# Patient Record
Sex: Male | Born: 1970 | Race: White | Hispanic: No | Marital: Married | Smoking: Former smoker
Health system: Southern US, Community
[De-identification: ages and names within clinical notes are randomized; demographics above are authoritative.]

## PROBLEM LIST (undated history)

## (undated) DIAGNOSIS — E215 Disorder of parathyroid gland, unspecified: Secondary | ICD-10-CM

## (undated) DIAGNOSIS — Z5189 Encounter for other specified aftercare: Secondary | ICD-10-CM

## (undated) DIAGNOSIS — E785 Hyperlipidemia, unspecified: Secondary | ICD-10-CM

## (undated) DIAGNOSIS — E7800 Pure hypercholesterolemia, unspecified: Secondary | ICD-10-CM

## (undated) DIAGNOSIS — K219 Gastro-esophageal reflux disease without esophagitis: Secondary | ICD-10-CM

## (undated) DIAGNOSIS — D619 Aplastic anemia, unspecified: Secondary | ICD-10-CM

## (undated) DIAGNOSIS — I10 Essential (primary) hypertension: Secondary | ICD-10-CM

## (undated) HISTORY — DX: Disorder of parathyroid gland, unspecified: E21.5

## (undated) HISTORY — DX: Gastro-esophageal reflux disease without esophagitis: K21.9

## (undated) HISTORY — PX: VASECTOMY: SHX75

## (undated) HISTORY — DX: Encounter for other specified aftercare: Z51.89

## (undated) HISTORY — DX: Hyperlipidemia, unspecified: E78.5

## (undated) HISTORY — PX: PARATHYROIDECTOMY: SHX19

## (undated) HISTORY — DX: Pure hypercholesterolemia, unspecified: E78.00

## (undated) HISTORY — DX: Essential (primary) hypertension: I10

## (undated) HISTORY — DX: Aplastic anemia, unspecified: D61.9

## (undated) HISTORY — PX: BONE MARROW TRANSPLANT: SHX200

## (undated) HISTORY — PX: OTHER SURGICAL HISTORY: SHX169

---

## 1996-01-13 DIAGNOSIS — D619 Aplastic anemia, unspecified: Secondary | ICD-10-CM

## 1996-01-13 HISTORY — DX: Aplastic anemia, unspecified: D61.9

## 1996-01-13 HISTORY — PX: BONE MARROW TRANSPLANT: SHX200

## 2001-05-20 ENCOUNTER — Emergency Department (HOSPITAL_COMMUNITY): Admission: EM | Admit: 2001-05-20 | Discharge: 2001-05-20 | Payer: Self-pay | Admitting: Emergency Medicine

## 2001-05-21 ENCOUNTER — Emergency Department (HOSPITAL_COMMUNITY): Admission: EM | Admit: 2001-05-21 | Discharge: 2001-05-21 | Payer: Self-pay | Admitting: Emergency Medicine

## 2001-05-24 ENCOUNTER — Encounter: Payer: Self-pay | Admitting: Internal Medicine

## 2001-05-24 ENCOUNTER — Ambulatory Visit (HOSPITAL_COMMUNITY): Admission: RE | Admit: 2001-05-24 | Discharge: 2001-05-24 | Payer: Self-pay | Admitting: Internal Medicine

## 2002-05-23 ENCOUNTER — Encounter: Payer: Self-pay | Admitting: Internal Medicine

## 2002-05-23 ENCOUNTER — Ambulatory Visit (HOSPITAL_COMMUNITY): Admission: RE | Admit: 2002-05-23 | Discharge: 2002-05-23 | Payer: Self-pay | Admitting: Internal Medicine

## 2002-09-01 ENCOUNTER — Encounter: Payer: Self-pay | Admitting: Orthopedic Surgery

## 2002-09-01 ENCOUNTER — Ambulatory Visit: Admission: RE | Admit: 2002-09-01 | Discharge: 2002-09-01 | Payer: Self-pay | Admitting: Orthopedic Surgery

## 2002-09-04 ENCOUNTER — Encounter: Payer: Self-pay | Admitting: Orthopedic Surgery

## 2002-09-04 ENCOUNTER — Ambulatory Visit (HOSPITAL_COMMUNITY): Admission: RE | Admit: 2002-09-04 | Discharge: 2002-09-04 | Payer: Self-pay | Admitting: Orthopedic Surgery

## 2004-01-25 ENCOUNTER — Ambulatory Visit (HOSPITAL_COMMUNITY): Admission: RE | Admit: 2004-01-25 | Discharge: 2004-01-25 | Payer: Self-pay | Admitting: Surgery

## 2004-02-04 ENCOUNTER — Ambulatory Visit (HOSPITAL_COMMUNITY): Admission: RE | Admit: 2004-02-04 | Discharge: 2004-02-04 | Payer: Self-pay | Admitting: Surgery

## 2004-04-03 ENCOUNTER — Ambulatory Visit (HOSPITAL_COMMUNITY): Admission: RE | Admit: 2004-04-03 | Discharge: 2004-04-04 | Payer: Self-pay | Admitting: Surgery

## 2010-02-01 ENCOUNTER — Encounter: Payer: Self-pay | Admitting: Surgery

## 2010-04-13 ENCOUNTER — Inpatient Hospital Stay (INDEPENDENT_AMBULATORY_CARE_PROVIDER_SITE_OTHER)
Admission: RE | Admit: 2010-04-13 | Discharge: 2010-04-13 | Disposition: A | Discharge status: Home / Self Care | Payer: Commercial Managed Care - PPO | Source: Ambulatory Visit | Attending: Family Medicine | Admitting: Family Medicine

## 2010-04-13 ENCOUNTER — Ambulatory Visit (INDEPENDENT_AMBULATORY_CARE_PROVIDER_SITE_OTHER): Payer: Commercial Managed Care - PPO

## 2010-04-13 DIAGNOSIS — S60229A Contusion of unspecified hand, initial encounter: Secondary | ICD-10-CM

## 2012-10-11 ENCOUNTER — Ambulatory Visit (HOSPITAL_COMMUNITY)
Admission: RE | Admit: 2012-10-11 | Discharge: 2012-10-11 | Disposition: A | Discharge status: Home / Self Care | Payer: 59 | Source: Ambulatory Visit | Attending: Orthopedic Surgery | Admitting: Orthopedic Surgery

## 2012-10-11 DIAGNOSIS — M79609 Pain in unspecified limb: Secondary | ICD-10-CM | POA: Insufficient documentation

## 2012-10-11 DIAGNOSIS — R262 Difficulty in walking, not elsewhere classified: Secondary | ICD-10-CM | POA: Insufficient documentation

## 2012-10-11 DIAGNOSIS — M79672 Pain in left foot: Secondary | ICD-10-CM | POA: Insufficient documentation

## 2012-10-11 DIAGNOSIS — IMO0001 Reserved for inherently not codable concepts without codable children: Secondary | ICD-10-CM | POA: Insufficient documentation

## 2012-10-11 NOTE — Evaluation (Signed)
Physical Therapy Evaluation  Patient Details  Name: Kyle Gilbert MRN: [id number] Date of Birth: 27-Oct-1970  Today's Date: 10/11/2012 Time: 0801-0845 PT Time Calculation (min): 44 min Charges: 1 evaluation 8' [place]             Visit#: 1 of 8  Re-eval: 11/10/12 Assessment Diagnosis: Lt heel pain Next MD Visit: Dr. Victorino Dike  Past Medical History: Aplastic Anemia w/bone marrow transplant; Avascular necrosis Bil hips Past Surgical History: No past surgical history on file.  Subjective Symptoms/Limitations Symptoms: Pt is a 42 year old male referred to PT for Lt heel pain which he reports has started about a year ago.  He believes it is due to his positioning during driving and places most of his weight on his heel.  He reports that he has not been wearing the best footwear for the past few months.  He denies use of orthotics.  He has been trying some gentle ROM exercises given to him by his MD and states that it helps.  Most pain is when he is putting full weight on it.  States that he "clomps around the house" and notices he does not push off on his toes as much as he strikes his heel.  Pertinent History: apasltic anemia with avascular necrosis to Bil Hips - reports he is always in pain with his hips.  How long can you walk comfortably?: moderate difficulty with 2 blocks.  Patient Stated Goals: decrease pain and be able to walk longer without as much pain.  Pain Assessment Currently in Pain?: Yes Pain Score: 3  Pain Location: Heel Pain Orientation: Left Pain Type: Chronic pain Pain Onset: More than a month ago Pain Frequency: Intermittent Pain Relieving Factors: sitting without pressure on it.  Effect of Pain on Daily Activities: difficulty walking without pain.   Balance Screening Balance Screen Has the patient fallen in the past 6 months: No Has the patient had a decrease in activity level because of a fear of falling? : No Is the patient reluctant to leave their home because of a  fear of falling? 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 Comments: Enjoys being with his family, playing golf  Cognition/Observation Observation/Other Assessments Observations: Lt pes planus w.moderate toe out  Sensation/Coordination/Flexibility/Functional Tests Coordination Gross Motor Movements are Fluid and Coordinated: No Coordination and Movement Description: able to spread toes; impaired toe flexion and extension coordinated movements  Assessment LLE PROM (degrees) LLE Overall PROM Comments: Ankle Inversion: 20; Ankle Eversion: 20 Left Ankle Dorsiflexion: 5 Left Ankle Plantar Flexion: 40 LLE Strength Left Hip Flexion: 5/5 Left Hip Extension: 5/5 Left Hip ABduction: 5/5 Left Hip ADduction: 5/5 Left Knee Flexion: 4/5 Left Knee Extension: 5/5 Left Ankle Dorsiflexion: 5/5 Left Ankle Plantar Flexion: 4/5 Left Ankle Inversion: 5/5 Left Ankle Eversion: 5/5 Palpation Palpation: pain and tenderness with moderate palpation to lateral calcaneous.  Moderate fascial restrictions to lateral calcaneous, plantar fascia and gastroc.  Gastroc muscle spasms.  Decreased posterior tibialis muscle activation   Mobility/Balance  Ambulation/Gait Ambulation/Gait: Yes Gait Pattern: Antalgic;Lateral hip instability (Lt toe out, Lt pes planus)   Exercise/Treatments Ankle Stretches Soleus Stretch: 1 rep;30 seconds Gastroc Stretch: 1 rep;30 seconds Other Stretch: Long sitting hamstring/gastroc stretch w/towel Ankle Exercises - Seated Towel Crunch: 1 rep Other Seated Ankle Exercises: Attempted Toe Yoga: flexion and extension; Toe abduction 10 reps  Modalities Modalities: Ultrasound Manual Therapy Manual  Therapy: Myofascial release Myofascial Release: prone to Lt gastroc to decrease fascial restrictions.  Ultrasound Ultrasound Location: Prone to Lt  heel 8 minutes 0.8 w/cm2 cont. small head 3 mHz Ultrasound Goals: Pain  Physical Therapy Assessment and Plan PT Assessment and Plan Clinical Impression Statement: Pt is a 42 year old male referred to PT for Lt heel pain with impairments listed below.  at this time pt is most limited by his significant Lt pes planus with gastroc tightness and decreased posterior tibialis muscule activaition.  Pt will benefit from skilled therapeutic intervention in order to improve on the following deficits: Abnormal gait;Decreased strength;Decreased range of motion;Impaired flexibility;Pain PT Frequency: Min 2X/week PT Duration: 6 weeks PT Treatment/Interventions: Gait training;Stair training;Functional mobility training;Therapeutic activities;Therapeutic exercise;Balance training;Neuromuscular re-education;Patient/family education;Manual techniques;Modalities PT Plan: Pt is allowed iontophoresis if needed.  Continue with foot strengthening activities, Ultrasound and manual techniques to decrease pain.  Gastroc, solues and plantar fascia stretching. Towel crunches, towel inversion, eversion, Theraband 4 way w/blue.  Progress towards rocker board, heel and toe raises, toe walking, vector stance.  Progress towards SLS on foam     Goals Home Exercise Program Pt/caregiver will Perform Home Exercise Program: Independently PT Goal: Perform Home Exercise Program - Progress: Goal set today PT Short Term Goals Time to Complete Short Term Goals: 3 weeks PT Short Term Goal 1: Pt will present with minimal fascial restrictions to Lt gastroc, calcenous and plantar fascial region for decreased pain.  PT Short Term Goal 2: Pt will report heel pain less than a 2/10 while wearing shoes for work and driving.  PT Short Term Goal 3: Pt will improve his funcitonal gastroc PF strength and perform 10 single leg heel lifts to encourage toe off during ambulation.  PT Short Term Goal 4: Pt will improve hamstring strength to 5/5 in order  to ambulate with improved mechanics.  PT Long Term Goals Time to Complete Long Term Goals:  (6 weeks) PT Long Term Goal 1: Pt will present without fascial restriction to Lt heel and gastroc in order to report 1/10 pain to partiicpate in golfing activities.  PT Long Term Goal 2: Pt will improve improve hip strength to [facility] in order to ambulate with normalized gait mechanics for 1 mile in order to attend childrens activities.   Problem List Patient Active Problem List   Diagnosis Date Noted  . Pain of left heel 10/11/2012    PT - End of Session Activity Tolerance: Patient tolerated treatment well General Behavior During Therapy: WFL for tasks assessed/performed PT Plan of Care PT Home Exercise Plan: given PT Patient Instructions: importance of HEP, orthotics for Lt foot.  Provided with written information for ice application.  Consulted and Agree with Plan of Care: Patient;Family member/caregiver Family Member Consulted: wife  Waynetta Sandy)  GP    Hartford Maulden, MPT, ATC 10/11/2012, 2:45 PM  Physician Documentation Your signature is required to indicate approval of the treatment plan as stated above.  Please sign and either send electronically or make a copy of this report for your files and return this physician signed original.   Please mark one 1.__approve of plan  2. ___approve of plan with the following conditions.   ______________________________  _____________________ Physician Signature                                                                                                             Date

## 2012-10-13 ENCOUNTER — Ambulatory Visit (HOSPITAL_COMMUNITY)
Admission: RE | Admit: 2012-10-13 | Discharge: 2012-10-13 | Disposition: A | Discharge status: Home / Self Care | Payer: 59 | Source: Ambulatory Visit | Attending: Orthopedic Surgery | Admitting: Orthopedic Surgery

## 2012-10-13 DIAGNOSIS — IMO0001 Reserved for inherently not codable concepts without codable children: Secondary | ICD-10-CM | POA: Insufficient documentation

## 2012-10-13 DIAGNOSIS — M79672 Pain in left foot: Secondary | ICD-10-CM

## 2012-10-13 DIAGNOSIS — M79609 Pain in unspecified limb: Secondary | ICD-10-CM | POA: Insufficient documentation

## 2012-10-13 DIAGNOSIS — R262 Difficulty in walking, not elsewhere classified: Secondary | ICD-10-CM | POA: Insufficient documentation

## 2012-10-13 NOTE — Progress Notes (Signed)
Physical Therapy Treatment Patient Details  Name: Kyle Gilbert MRN: [id number] Date of Birth: 1970-01-25  Today's Date: 10/13/2012 Time: 0801-0845 PT Time Calculation (min): 44 min Charges:  [place]: [id number] Manual: [id number] TE: [phone] Visit#: 2 of 8  Re-eval: 11/10/12    Subjective: Symptoms/Limitations Symptoms: Pt reports that he has been stretching like crazy.   Precautions/Restrictions     Exercise/Treatments Ankle Stretches Plantar Fascia Stretch: 1 rep;60 seconds;Limitations Plantar Fascia Stretch Limitations: 4in step Soleus Stretch: 3 reps;30 seconds Slant Board Stretch: 3 reps;30 seconds Ankle Exercises - Seated Towel Crunch: 5 reps Towel Inversion/Eversion: 5 reps;Weights (BLE) Towel Inversion/Eversion Weights (lbs): 4 Marble Pickup: 2 reps 10 marbles    Modalities Modalities: Ultrasound Manual Therapy Manual Therapy: Myofascial release Myofascial Release: prone to Lt gastroc and calcaneous and plantar fascia to decrease fascial restrictions Ultrasound Ultrasound Location: Prone to Lt heel 8 minutes 0.8 w/cm2 cont. small head 3 mHz Ultrasound Goals: Pain  Physical Therapy Assessment and Plan PT Assessment and Plan Clinical Impression Statement: Added activities to improve his Lt foot strength and flexibility.  Pt presents with min-mod restrictions after manual, worse to his gastroc region and lateral heel.  Pt has decreased pain with weight bearing after session. Enocuraged to continue with stretching today and discussed iceing foot at end of the day for 8-10 minutes.  Pt will benefit from skilled therapeutic intervention in order to improve on the following deficits: Abnormal gait;Decreased strength;Decreased range of motion;Impaired flexibility;Pain PT Frequency: Min 2X/week PT Duration: 6 weeks PT Treatment/Interventions: Gait training;Stair training;Functional mobility training;Therapeutic activities;Therapeutic exercise;Balance training;Neuromuscular  re-education;Patient/family education;Manual techniques;Modalities PT Plan: Pt is allowed iontophoresis if needed.  Continue with foot strengthening activities, Ultrasound and manual techniques to decrease pain.   Theraband 4 way w/blue.  Progress towards rocker board, heel and toe raises, toe walking, vector stance.  Progress towards SLS on foam     Goals Home Exercise Program Pt/caregiver will Perform Home Exercise Program: Independently PT Goal: Perform Home Exercise Program - Progress: Progressing toward goal PT Short Term Goals Time to Complete Short Term Goals: 3 weeks PT Short Term Goal 1: Pt will present with minimal fascial restrictions to Lt gastroc, calcenous and plantar fascial region for decreased pain.  PT Short Term Goal 1 - Progress: Progressing toward goal PT Short Term Goal 2: Pt will report heel pain less than a 2/10 while wearing shoes for work and driving.  PT Short Term Goal 2 - Progress: Progressing toward goal PT Short Term Goal 3: Pt will improve his funcitonal gastroc PF strength and perform 10 single leg heel lifts to encourage toe off during ambulation.  PT Short Term Goal 3 - Progress: Progressing toward goal PT Short Term Goal 4: Pt will improve hamstring strength to 5/5 in order to ambulate with improved mechanics.  PT Short Term Goal 4 - Progress: Progressing toward goal PT Long Term Goals Time to Complete Long Term Goals:  (6 weeks) PT Long Term Goal 1: Pt will present without fascial restriction to Lt heel and gastroc in order to report 1/10 pain to partiicpate in golfing activities.  PT Long Term Goal 2: Pt will improve improve hip strength to [facility] in order to ambulate with normalized gait mechanics for 1 mile in order to attend childrens activities.   Problem List Patient Active Problem List   Diagnosis Date Noted  . Pain of left heel 10/11/2012    PT - End of Session Activity Tolerance: Patient tolerated treatment well General Behavior During  Therapy:  WFL for tasks assessed/performed PT Plan of Care PT Home Exercise Plan: given PT Patient Instructions: importance of HEP, orthotics for Lt foot.  Provided with written information for ice application.  Consulted and Agree with Plan of Care: Patient;Family member/caregiver Family Member Consulted: wife  Kyle Gilbert)  GP    Kyle Gilbert, MPT, ATC 10/13/2012, 8:58 AM

## 2012-10-18 ENCOUNTER — Ambulatory Visit (HOSPITAL_COMMUNITY)
Admission: RE | Admit: 2012-10-18 | Discharge: 2012-10-18 | Disposition: A | Discharge status: Home / Self Care | Payer: 59 | Source: Ambulatory Visit | Attending: Family Medicine | Admitting: Family Medicine

## 2012-10-18 NOTE — Progress Notes (Signed)
Physical Therapy Treatment Patient Details  Name: Kyle Gilbert MRN: [id number] Date of Birth: 19-Jan-1970  Today's Date: 10/18/2012 Time: 0932-1019 PT Time Calculation (min): 47 min Charges: Therex x 18' [place] x 8' Manual x 18'  Visit#: 3 of 8  Re-eval: 11/10/12  Subjective: Symptoms/Limitations Symptoms: Pt reports continued HEP compliance. Pain Assessment Currently in Pain?: Yes Pain Score: 3  Pain Location: Heel Pain Orientation: Left  Exercise/Treatments Ankle Stretches Plantar Fascia Stretch: 1 rep;60 seconds;Limitations Soleus Stretch: 3 reps;30 seconds Slant Board Stretch: 3 reps;30 seconds Ankle Exercises - Seated Towel Inversion/Eversion: 5 reps;Weights Towel Inversion/Eversion Weights (lbs): 5 Marble Pickup: 1x10 (D/C this activity)  Modalities Modalities: Ultrasound Manual Therapy Manual Therapy: Myofascial release Myofascial Release: prone to Lt gastroc/soleus and calcaneous and plantar fascia to decrease fascial restrictions Ultrasound Ultrasound Location: Prone to Lt heel Ultrasound Parameters: 8 minutes 0.8 w/cm2 cont. small head 3 mHz Ultrasound Goals: Pain  Physical Therapy Assessment and Plan PT Assessment and Plan Clinical Impression Statement: Pt displays improved intrinsic strength in left foot. PT is able to complete towel and marble exercises without difficulty. Continued with ultrasound and manual techniques to decrease pain and tightness. Pt reports pain decrease to 0/10 at end of session. Pt will benefit from skilled therapeutic intervention in order to improve on the following deficits: Abnormal gait;Decreased strength;Decreased range of motion;Impaired flexibility;Pain PT Frequency: Min 2X/week PT Duration: 6 weeks PT Treatment/Interventions: Gait training;Stair training;Functional mobility training;Therapeutic activities;Therapeutic exercise;Balance training;Neuromuscular re-education;Patient/family education;Manual techniques;Modalities PT  Plan: Pt is allowed iontophoresis if needed.  Continue with foot strengthening activities, Ultrasound and manual techniques to decrease pain.   Theraband 4 way w/blue.  Progress towards rocker board, heel and toe raises, toe walking, vector stance.  Progress towards SLS on foam      Problem List Patient Active Problem List   Diagnosis Date Noted  . Pain of left heel 10/11/2012    PT - End of Session Activity Tolerance: Patient tolerated treatment well General Behavior During Therapy: [facility] for tasks assessed/performed  Seth Bake, PTA  10/18/2012, 12:16 PM

## 2012-10-20 ENCOUNTER — Ambulatory Visit (HOSPITAL_COMMUNITY)
Admission: RE | Admit: 2012-10-20 | Discharge: 2012-10-20 | Disposition: A | Discharge status: Home / Self Care | Payer: 59 | Source: Ambulatory Visit | Attending: Orthopedic Surgery | Admitting: Orthopedic Surgery

## 2012-10-20 ENCOUNTER — Encounter (INDEPENDENT_AMBULATORY_CARE_PROVIDER_SITE_OTHER): Payer: Self-pay

## 2012-10-20 NOTE — Progress Notes (Signed)
Physical Therapy Treatment Patient Details  Name: Kyle Gilbert MRN: [id number] Date of Birth: 08-Mar-1970  Today's Date: 10/20/2012 Time: 0800-0845 PT Time Calculation (min): 45 min Charges: Therex x 20' Ultrasound x 8' Manual x 15'  Visit#: 4 of 8  Re-eval: 11/10/12  Subjective: Symptoms/Limitations Symptoms: Pt states that foot has been feeling much better since his last visit. Pain Assessment Currently in Pain?: Yes Pain Score: 1  Pain Location: Heel Pain Orientation: Left   Exercise/Treatments  Ankle Stretches Plantar Fascia Stretch: 1 rep;60 seconds;Limitations Slant Board Stretch: 3 reps;30 seconds Ankle Exercises - Standing SLS: LLE 1' Rocker Board: 2 minutes;Limitations Rocker Board Limitations: A/P Ankle Exercises - Seated Heel Slides: 10 reps;Left;Limitations Heel Slides Limitations: All directions L 2  Modalities Modalities: Ultrasound Manual Therapy Manual Therapy: Myofascial release Myofascial Release: prone to Lt gastroc/soleus and calcaneous and plantar fascia to decrease fascial restrictions Ultrasound Ultrasound Location: Prone to left heel Ultrasound Parameters: 8 minutes 0.8 w/cm2 cont. small head 3 mHz Ultrasound Goals: Pain  Physical Therapy Assessment and Plan PT Assessment and Plan Clinical Impression Statement: Progressed to standing exercises with minimal difficulty. Pt requires multimodal cueing to properly complete BAPS board in seated position. Continued manual techniques and ultrasound to decrease fascial restrictions and pain. Pt reports pain decrease to 1/10 at end of session. Pt will benefit from skilled therapeutic intervention in order to improve on the following deficits: Abnormal gait;Decreased strength;Decreased range of motion;Impaired flexibility;Pain PT Frequency: Min 2X/week PT Duration: 6 weeks PT Treatment/Interventions: Gait training;Stair training;Functional mobility training;Therapeutic activities;Therapeutic  exercise;Balance training;Neuromuscular re-education;Patient/family education;Manual techniques;Modalities PT Plan: Pt is allowed iontophoresis if needed.  Continue with foot strengthening activities, Ultrasound and manual techniques to decrease pain.   Theraband 4 way w/blue.  Progress towards rocker board, heel and toe raises, toe walking, vector stance.  Progress towards SLS on foam      Problem List Patient Active Problem List   Diagnosis Date Noted  . Pain of left heel 10/11/2012    PT - End of Session Activity Tolerance: Patient tolerated treatment well General Behavior During Therapy: [facility] for tasks assessed/performed  Seth Bake, PTA  10/20/2012, 9:39 AM

## 2012-10-24 ENCOUNTER — Inpatient Hospital Stay (HOSPITAL_COMMUNITY)
Admission: RE | Admit: 2012-10-24 | Payer: Commercial Managed Care - PPO | Source: Ambulatory Visit | Admitting: *Deleted

## 2012-10-25 ENCOUNTER — Ambulatory Visit (HOSPITAL_COMMUNITY)
Admission: RE | Admit: 2012-10-25 | Discharge: 2012-10-25 | Disposition: A | Discharge status: Home / Self Care | Payer: 59 | Source: Ambulatory Visit | Attending: Orthopedic Surgery | Admitting: Orthopedic Surgery

## 2012-10-25 NOTE — Progress Notes (Signed)
Physical Therapy Treatment Patient Details  Name: Kyle Gilbert MRN: [id number] Date of Birth: 08/09/1970  Today's Date: 10/25/2012 Time: 0802-0840 PT Time Calculation (min): 38 min Charges: Therex x 15' Manual x 12' [place] x 8  Visit#: 5 of 8  Re-eval: 11/10/12    Subjective: Symptoms/Limitations Symptoms: Pt states that he bought new shoes and is using insoles. He states that his pain is decreasing.  Pain Assessment Currently in Pain?: Yes Pain Score: 2  Pain Location: Heel Pain Orientation: Left   Exercise/Treatments Ankle Stretches Plantar Fascia Stretch: 1 rep;60 seconds;Limitations Slant Board Stretch: 2 reps;60 seconds Ankle Exercises - Standing BAPS: Level 2;10 reps;Standing Rocker Board: 2 minutes;Limitations Rocker Board Limitations: A/P  Modalities Modalities: Ultrasound Manual Therapy Manual Therapy: Myofascial release Myofascial Release: prone to Lt gastroc/soleus and calcaneous and plantar fascia to decrease fascial restrictions Ultrasound Ultrasound Location: Prone to left heel Ultrasound Parameters: 8 minutes 0.8 w/cm2 cont. small head 3 mHz Ultrasound Goals: Pain  Physical Therapy Assessment and Plan PT Assessment and Plan Clinical Impression Statement: Pt continues to progress well. Began BAPs in standing position with minimal difficulty after initial cueing and demo. Tightness in soleus and gastroc continues to decrease. Pt reports pain decrease to 1/10 at end of session. Pt will benefit from skilled therapeutic intervention in order to improve on the following deficits: Abnormal gait;Decreased strength;Decreased range of motion;Impaired flexibility;Pain PT Frequency: Min 2X/week PT Duration: 6 weeks PT Treatment/Interventions: Gait training;Stair training;Functional mobility training;Therapeutic activities;Therapeutic exercise;Balance training;Neuromuscular re-education;Patient/family education;Manual techniques;Modalities PT Plan: Pt is allowed  iontophoresis if needed.  Continue with foot strengthening activities, Ultrasound and manual techniques to decrease pain.   Theraband 4 way w/blue.  Progress towards rocker board, heel and toe raises, toe walking, vector stance.  Progress towards SLS on foam     Problem List Patient Active Problem List   Diagnosis Date Noted  . Pain of left heel 10/11/2012    PT - End of Session Activity Tolerance: Patient tolerated treatment well General Behavior During Therapy: [facility] for tasks assessed/performed  Seth Bake, PTA  10/25/2012, 9:23 AM

## 2012-10-27 ENCOUNTER — Ambulatory Visit (HOSPITAL_COMMUNITY): Payer: Commercial Managed Care - PPO | Admitting: *Deleted

## 2012-11-07 ENCOUNTER — Ambulatory Visit (HOSPITAL_COMMUNITY)
Admission: RE | Admit: 2012-11-07 | Discharge: 2012-11-07 | Disposition: A | Discharge status: Home / Self Care | Payer: 59 | Source: Ambulatory Visit | Attending: *Deleted | Admitting: *Deleted

## 2012-11-07 NOTE — Progress Notes (Signed)
Physical Therapy Treatment Patient Details  Name: Kyle Gilbert MRN: [id number] Date of Birth: [birth year]  Today's Date: 11/07/2012 Time: 4098-1191 PT Time Calculation (min): 43 min Charges: Therex x 12' Manual x 18' [place] x 9'  Visit#: 6 of 8  Re-eval: 11/10/12   Subjective: Symptoms/Limitations Symptoms: Pt went to [employer] last week and did a lot of walking in sandals. Pain Assessment Currently in Pain?: Yes Pain Score: 3  Pain Location: Heel Pain Orientation: Left  Exercise/Treatments Ankle Stretches Slant Board Stretch: 2 reps;60 seconds Ankle Exercises - Standing BAPS: Level 3;10 reps Rocker Board: 2 minutes;Limitations Rocker Board Limitations: A/P  Modalities Modalities: Iontophoresis Manual Therapy Manual Therapy: Myofascial release Myofascial Release: prone to Lt gastroc/soleus and calcaneous to decrease fascial restrictions Iontophoresis Type of Iontophoresis: Dexamethasone Location: Lateral calcaneus  Dose: 1.5 cc dex  Time: 5' set up; 4' treatment  Physical Therapy Assessment and Plan PT Assessment and Plan Clinical Impression Statement: Treatment focus on decreasing fascial restriction and pain. Pt displays improve control with BAPS. Manual techniques completed to left calf and calcaneus. Began iontophoresis to decrease inflammation. Pt reports pain decrease to 1/10 at end of session. Pt will benefit from skilled therapeutic intervention in order to improve on the following deficits: Abnormal gait;Decreased strength;Decreased range of motion;Impaired flexibility;Pain PT Frequency: Min 2X/week PT Duration: 6 weeks PT Treatment/Interventions: Gait training;Stair training;Functional mobility training;Therapeutic activities;Therapeutic exercise;Balance training;Neuromuscular re-education;Patient/family education;Manual techniques;Modalities PT Plan: Assess reaction to iontophoresis next session. Continue with foot strengthening activities and manual  techniques to decrease pain.   Theraband 4 way w/blue.  Progress towards rocker board, heel and toe raises, toe walking, vector stance.  Progress towards SLS on foam     Problem List Patient Active Problem List   Diagnosis Date Noted  . Pain of left heel 10/11/2012    PT - End of Session Activity Tolerance: Patient tolerated treatment well General Behavior During Therapy: [facility] for tasks assessed/performed   Seth Bake, PTA  11/07/2012, 6:19 PM

## 2012-11-10 ENCOUNTER — Ambulatory Visit (HOSPITAL_COMMUNITY)
Admission: RE | Admit: 2012-11-10 | Discharge: 2012-11-10 | Disposition: A | Discharge status: Home / Self Care | Payer: 59 | Source: Ambulatory Visit | Attending: Orthopedic Surgery | Admitting: Orthopedic Surgery

## 2012-11-10 DIAGNOSIS — M79672 Pain in left foot: Secondary | ICD-10-CM

## 2012-11-10 NOTE — Progress Notes (Signed)
Physical Therapy Treatment Patient Details  Name: Kyle Gilbert MRN: [id number] Date of Birth: 02/05/1970  Today's Date: 11/10/2012 Time: 0805-0845 PT Time Calculation (min): 40 min Charges Manual: [id number] [place]: [id number] Visit#: 7 of 8  Re-eval: 11/10/12    Authorization:    Authorization Time Period:    Authorization Visit#:   of     Subjective: Symptoms/Limitations Symptoms: Pt reports that the medicine really helped a lot last time.  Pain Assessment Currently in Pain?: Yes Pain Score: 2  Pain Location: Heel Pain Orientation: Left  Precautions/Restrictions     Exercise/Treatments  Modalities Modalities: Iontophoresis Manual Therapy Manual Therapy: Massage Massage: using "the stick" self massager.  Myofascial Release: prone to Lt gastroc/soleus and calcaneous to decrease fascial restriction Iontophoresis Type of Iontophoresis: Dexamethasone Location: Lateral calcaneus  Dose: 1.5 cc dex  Time: 5' set up; 4' treatment  #2  Physical Therapy Assessment and Plan PT Assessment and Plan Clinical Impression Statement: Pt continues to have moderate fascial restrictions to foot and muscle spasms to gastroc region intially.  Decreases to minimal spasms and restrictions after manual therapy.  Continued with ionto today to decrease pain.  Pt will benefit from skilled therapeutic intervention in order to improve on the following deficits: Abnormal gait;Decreased strength;Decreased range of motion;Impaired flexibility;Pain PT Frequency: Min 2X/week PT Duration: 6 weeks PT Treatment/Interventions: Gait training;Stair training;Functional mobility training;Therapeutic activities;Therapeutic exercise;Balance training;Neuromuscular re-education;Patient/family education;Manual techniques;Modalities PT Plan: Continue with ionto if necessary.  Manual techniques, stretching and ankle exercises if needed.     Goals    Problem List Patient Active Problem List   Diagnosis Date Noted   . Pain of left heel 10/11/2012    PT - End of Session Activity Tolerance: Patient tolerated treatment well General Behavior During Therapy: [facility] for tasks assessed/performed  GP    Haley Fuerstenberg 11/10/2012, 9:18 AM

## 2012-11-14 ENCOUNTER — Ambulatory Visit (HOSPITAL_COMMUNITY)
Admission: RE | Admit: 2012-11-14 | Discharge: 2012-11-14 | Disposition: A | Discharge status: Home / Self Care | Payer: 59 | Source: Ambulatory Visit | Attending: Orthopedic Surgery | Admitting: Orthopedic Surgery

## 2012-11-14 DIAGNOSIS — M79609 Pain in unspecified limb: Secondary | ICD-10-CM | POA: Insufficient documentation

## 2012-11-14 DIAGNOSIS — R262 Difficulty in walking, not elsewhere classified: Secondary | ICD-10-CM | POA: Insufficient documentation

## 2012-11-14 DIAGNOSIS — IMO0001 Reserved for inherently not codable concepts without codable children: Secondary | ICD-10-CM | POA: Insufficient documentation

## 2012-11-14 NOTE — Progress Notes (Signed)
Physical Therapy Treatment Patient Details  Name: Kyle Gilbert MRN: [id number] Date of Birth: Jan 08, 1971  Today's Date: 11/14/2012 Time: 0800-0848 PT Time Calculation (min): 48 min Visit#: 8 of 11  Re-eval: 11/22/12 Charges:  therex (18') 800-818, manual 820-835 (15'), ionto with dex X 1 at end of session  Subjective: Symptoms/Limitations Symptoms: Pt states he can tell a difference since beginning ionto last week.  States he currently is not hurting, just slight discomfort.  Pt is wearing his tennnis shoes today. Pain Assessment Currently in Pain?: No/denies   Exercise/Treatments Ankle Stretches Slant Board Stretch: 2 reps;60 seconds Ankle Exercises - Standing BAPS: Level 3;10 reps Vector Stance: 5 reps;5 seconds Rocker Board: 2 minutes;Limitations Rocker Board Limitations: A/P Heel Walk (Round Trip): 2RT Toe Walk (Round Trip): 2RT    Modalities Modalities: Iontophoresis Manual Therapy Manual Therapy: Massage Myofascial Release: Prone to Lt gastroc/soleus and calcaneous to decrease fascial restrictions Iontophoresis Type of Iontophoresis: Dexamethasone Location: Lateral calcaneus  Dose: 1.5 cc dex  Time: 5' set up; 4' treatment  #3  Physical Therapy Assessment and Plan PT Assessment and Plan PT Assessment:  Overall improvement with less pain and tightness in ankle/foot. No pain voiced with any activity/exercise today. PT Plan: Continue with ionto and Manual techniques.  Add theraband strengthening next visit.     Problem List Patient Active Problem List   Diagnosis Date Noted  . Pain of left heel 10/11/2012    PT - End of Session Activity Tolerance: Patient tolerated treatment well General Behavior During Therapy: WFL for tasks assessed/performed   Lurena Nida, PTA/CLT 11/14/2012, 10:22 AM

## 2012-11-16 ENCOUNTER — Ambulatory Visit (HOSPITAL_COMMUNITY)
Admission: RE | Admit: 2012-11-16 | Discharge: 2012-11-16 | Disposition: A | Discharge status: Home / Self Care | Payer: 59 | Source: Ambulatory Visit | Attending: Physical Therapy | Admitting: Physical Therapy

## 2012-11-16 NOTE — Progress Notes (Signed)
Physical Therapy Treatment Patient Details  Name: Kyle Gilbert MRN: [id number] Date of Birth: Apr 25, 1970  Today's Date: 11/16/2012 Time: 1324-4010 PT Time Calculation (min): 48 min Visit#: 9 of 11  Re-eval: 11/22/12 Charges:  therex 1642-1700 (18'), manual 1702-1717 (15'), ionto 2725-3664 (10')    Subjective: Symptoms/Limitations Symptoms: Pt states slight discomfort today.  Wearing his merrells with insoles.  Exercise/Treatments Ankle Stretches Soleus Stretch: 2 reps;60 seconds Slant Board Stretch: 2 reps;60 seconds Ankle Exercises - Standing BAPS: Level 3;10 reps Vector Stance: 5 reps;5 seconds Rocker Board: 2 minutes;Limitations Rocker Board Limitations: A/P Heel Walk (Round Trip): 2RT Toe Walk (Round Trip): 2RT Ankle Exercises - Seated Other Seated Ankle Exercises: add theraband exercises next visit    Modalities Modalities: Iontophoresis Manual Therapy Manual Therapy: Massage Myofascial Release: Prone to Lt gastroc/soleus and calcaneous to decrease fascial restrictions; stick massager to gastroc/soleus Iontophoresis Type of Iontophoresis: Dexamethasone Location: Lateral calcaneus  Dose: 1.5 cc dex  Time: 5' set up; 4' treatment  #4  Physical Therapy Assessment and Plan PT Assessment and Plan Clinical Impression Statement: Improving coordination/control with ankle and foot.  Overall pain reduction with minimal restrictions felt in plantar fascia.  Forth iontophoresis treatment completed today. PT Plan: Continue with ionto and Manual techniques.  Add theraband strengthening next visit.  Re-evaluate X 2 visits.     Problem List Patient Active Problem List   Diagnosis Date Noted  . Pain of left heel 10/11/2012    PT - End of Session Activity Tolerance: Patient tolerated treatment well General Behavior During Therapy: [facility] for tasks assessed/performed   Lurena Nida, PTA/CLT 11/16/2012, 5:37 PM

## 2012-11-29 ENCOUNTER — Ambulatory Visit (HOSPITAL_COMMUNITY)
Admission: RE | Admit: 2012-11-29 | Discharge: 2012-11-29 | Disposition: A | Discharge status: Home / Self Care | Payer: 59 | Source: Ambulatory Visit | Attending: Orthopedic Surgery | Admitting: Orthopedic Surgery

## 2012-11-29 DIAGNOSIS — M79672 Pain in left foot: Secondary | ICD-10-CM

## 2012-11-29 NOTE — Progress Notes (Signed)
Physical Therapy Treatment Patient Details  Name: Kyle Gilbert MRN: [id number] Date of Birth: Mar 02, 1970  Today's Date: 11/29/2012 Time: 0802-0846 PT Time Calculation (min): 44 min Manual: 802-840 TE: 840-845 Visit#: 10 of 11  Re-eval: 11/22/12   Subjective: Symptoms/Limitations Symptoms: Pt reports he is doing better overall.  Pain remains 1-3/10 Pain Assessment Currently in Pain?: Yes Pain Score: 1  Pain Location: Heel Pain Orientation: Left  Precautions/Restrictions     Exercise/Treatments Manual Therapy Myofascial Release: Prone to Lt gastroc/soleus and calcaneous to decrease fascial restrictions w/gastroc stretch and solues stretch on slant board after to decrease pain (x5 minutes) Iontophoresis Type of Iontophoresis: Dexamethasone Location: Lateral calcaneus  Dose: 1.5 cc dex  Time: 2 hour take home pad.   Physical Therapy Assessment and Plan PT Assessment and Plan Clinical Impression Statement: Continued with manual therapy to decrease pain with ionto at end of session. Encouraged pt to think about massage therapy after d/c from PT to continue with decreased pain to heel.  Discussed likelyhood of continued pain as long as he has a bone spur.  PT Plan: Re-eval    Goals    Problem List Patient Active Problem List   Diagnosis Date Noted  . Pain of left heel 10/11/2012    PT - End of Session Activity Tolerance: Patient tolerated treatment well General Behavior During Therapy: [facility] for tasks assessed/performed  GP    Other Atienza 11/29/2012, 9:04 AM

## 2012-12-01 ENCOUNTER — Ambulatory Visit (HOSPITAL_COMMUNITY): Payer: Commercial Managed Care - PPO | Admitting: *Deleted

## 2014-01-12 HISTORY — PX: VASECTOMY: SHX75

## 2014-06-21 ENCOUNTER — Encounter (INDEPENDENT_AMBULATORY_CARE_PROVIDER_SITE_OTHER): Payer: Self-pay | Admitting: *Deleted

## 2014-07-23 ENCOUNTER — Ambulatory Visit (INDEPENDENT_AMBULATORY_CARE_PROVIDER_SITE_OTHER): Payer: 59 | Admitting: Internal Medicine

## 2014-08-06 ENCOUNTER — Other Ambulatory Visit (INDEPENDENT_AMBULATORY_CARE_PROVIDER_SITE_OTHER): Payer: Self-pay | Admitting: *Deleted

## 2014-08-06 ENCOUNTER — Encounter (INDEPENDENT_AMBULATORY_CARE_PROVIDER_SITE_OTHER): Payer: Self-pay | Admitting: *Deleted

## 2014-08-06 ENCOUNTER — Ambulatory Visit (INDEPENDENT_AMBULATORY_CARE_PROVIDER_SITE_OTHER): Payer: 59 | Admitting: Internal Medicine

## 2014-08-06 ENCOUNTER — Encounter (INDEPENDENT_AMBULATORY_CARE_PROVIDER_SITE_OTHER): Payer: Self-pay | Admitting: Internal Medicine

## 2014-08-06 DIAGNOSIS — I10 Essential (primary) hypertension: Secondary | ICD-10-CM | POA: Insufficient documentation

## 2014-08-06 DIAGNOSIS — D619 Aplastic anemia, unspecified: Secondary | ICD-10-CM | POA: Insufficient documentation

## 2014-08-06 DIAGNOSIS — E7800 Pure hypercholesterolemia, unspecified: Secondary | ICD-10-CM | POA: Insufficient documentation

## 2014-08-06 DIAGNOSIS — K219 Gastro-esophageal reflux disease without esophagitis: Secondary | ICD-10-CM

## 2014-08-06 NOTE — Patient Instructions (Signed)
The risks and benefits such as perforation, bleeding, and infection were reviewed with the patient and is agreeable. 

## 2014-08-06 NOTE — Progress Notes (Signed)
   Subjective:    Patient ID: Kyle Gilbert, male    DOB: 07/12/1970, 44 y.o.   MRN: [id number]  HPI Referred to our office by Dr. Tenny Craw [facility]) for GERD.  Patient presents today with c/o that two month ago, he was sitting in bed and had "the worst heartburn". It woke him up. He took Rolaids and [employer]. This has occurred x 2. Occurred at night. He takes Rolaids OTC as needed.' He takes acid reflux twice a week. Appetite is good. No weight loss. No abdominal pain. No dysphagia.  He usually has a BM once a day. No melena or BRRB.  Patient is concerned with this.  He does not avoid spicy foods.  Rarely takes Aleve Sleeping on two pillow to prevent the acid reflux. He also says he is constantly clearing his throat.     Review of Systems     Past Medical History  Diagnosis Date  . GERD (gastroesophageal reflux disease)   . Hypertension   . High cholesterol   . Aplastic anemia     Past Surgical History  Procedure Laterality Date  . 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   . Avascular necrosis      both hips   . Cataracts      bilateral.  . Vasectomy      Allergies  Allergen Reactions  . Benazepril     No current outpatient prescriptions on file prior to visit.   No current facility-administered medications on file prior to visit.     Objective:   Physical Exam Blood pressure 112/74, pulse 72, temperature 98.5 F (36.9 C), height  (1.753 m), weight 199 lb 6.4 oz (90.447 kg).  Alert and oriented. Skin warm and dry. Oral mucosa is moist.   . Sclera anicteric, conjunctivae is pink. Thyroid not enlarged. No cervical lymphadenopathy. Lungs clear. Heart regular rate and rhythm.  Abdomen is soft. Bowel sounds are positive. No hepatomegaly. No abdominal masses felt. No tenderness.  No edema to lower extremities.         Assessment & Plan:  EGD. PUD needs to be  ruled out. The risks and benefits such as perforation, bleeding, and infection were reviewed with the patient and is agreeable. Am going to hold PPI for now. May take Rolaids as needed.

## 2014-08-31 ENCOUNTER — Encounter (HOSPITAL_COMMUNITY): Payer: Self-pay | Admitting: *Deleted

## 2014-08-31 ENCOUNTER — Ambulatory Visit (HOSPITAL_COMMUNITY)
Admission: RE | Admit: 2014-08-31 | Discharge: 2014-08-31 | Disposition: A | Discharge status: Home / Self Care | Payer: 59 | Source: Ambulatory Visit | Attending: Internal Medicine | Admitting: Internal Medicine

## 2014-08-31 ENCOUNTER — Encounter (HOSPITAL_COMMUNITY)
Admission: RE | Disposition: A | Discharge status: Home / Self Care | Payer: Self-pay | Source: Ambulatory Visit | Attending: Internal Medicine

## 2014-08-31 DIAGNOSIS — K449 Diaphragmatic hernia without obstruction or gangrene: Secondary | ICD-10-CM | POA: Insufficient documentation

## 2014-08-31 DIAGNOSIS — K2980 Duodenitis without bleeding: Secondary | ICD-10-CM | POA: Insufficient documentation

## 2014-08-31 DIAGNOSIS — B9681 Helicobacter pylori [H. pylori] as the cause of diseases classified elsewhere: Secondary | ICD-10-CM | POA: Diagnosis not present

## 2014-08-31 DIAGNOSIS — Z79899 Other long term (current) drug therapy: Secondary | ICD-10-CM | POA: Diagnosis not present

## 2014-08-31 DIAGNOSIS — I10 Essential (primary) hypertension: Secondary | ICD-10-CM | POA: Diagnosis not present

## 2014-08-31 DIAGNOSIS — Z862 Personal history of diseases of the blood and blood-forming organs and certain disorders involving the immune mechanism: Secondary | ICD-10-CM | POA: Diagnosis not present

## 2014-08-31 DIAGNOSIS — R12 Heartburn: Secondary | ICD-10-CM | POA: Diagnosis present

## 2014-08-31 DIAGNOSIS — K2970 Gastritis, unspecified, without bleeding: Secondary | ICD-10-CM | POA: Diagnosis not present

## 2014-08-31 DIAGNOSIS — K2990 Gastroduodenitis, unspecified, without bleeding: Secondary | ICD-10-CM | POA: Diagnosis not present

## 2014-08-31 DIAGNOSIS — K219 Gastro-esophageal reflux disease without esophagitis: Secondary | ICD-10-CM | POA: Diagnosis not present

## 2014-08-31 DIAGNOSIS — Z87891 Personal history of nicotine dependence: Secondary | ICD-10-CM | POA: Diagnosis not present

## 2014-08-31 DIAGNOSIS — E780 Pure hypercholesterolemia: Secondary | ICD-10-CM | POA: Diagnosis not present

## 2014-08-31 HISTORY — PX: ESOPHAGOGASTRODUODENOSCOPY: SHX5428

## 2014-08-31 SURGERY — EGD (ESOPHAGOGASTRODUODENOSCOPY)
Anesthesia: Moderate Sedation

## 2014-08-31 MED ORDER — PANTOPRAZOLE SODIUM 40 MG PO TBEC: 40.0000 mg | DELAYED_RELEASE_TABLET | Freq: Every day | ORAL | Status: AC

## 2014-08-31 MED ORDER — PANTOPRAZOLE SODIUM 40 MG PO TBEC: 40.0000 mg | DELAYED_RELEASE_TABLET | Freq: Every day | ORAL | Status: DC

## 2014-08-31 MED ORDER — MIDAZOLAM HCL 5 MG/5ML IJ SOLN
INTRAMUSCULAR | Status: DC | PRN
  Administered 2014-08-31 (×2): 2 mg via INTRAVENOUS
  Administered 2014-08-31: 1 mg via INTRAVENOUS
  Administered 2014-08-31: 2 mg via INTRAVENOUS

## 2014-08-31 MED ORDER — STERILE WATER FOR IRRIGATION IR SOLN
Status: DC | PRN
  Administered 2014-08-31: 13:00:00

## 2014-08-31 MED ORDER — MIDAZOLAM HCL 5 MG/5ML IJ SOLN
INTRAMUSCULAR | Status: AC
  Filled 2014-08-31: qty 10

## 2014-08-31 MED ORDER — MEPERIDINE HCL 50 MG/ML IJ SOLN
INTRAMUSCULAR | Status: DC | PRN
  Administered 2014-08-31 (×2): 25 mg via INTRAVENOUS

## 2014-08-31 MED ORDER — BUTAMBEN-TETRACAINE-BENZOCAINE 2-2-14 % EX AERO
INHALATION_SPRAY | CUTANEOUS | Status: AC
  Filled 2014-08-31: qty 20

## 2014-08-31 MED ORDER — SODIUM CHLORIDE 0.9 % IV SOLN
INTRAVENOUS | Status: DC
  Administered 2014-08-31: 1000 mL via INTRAVENOUS

## 2014-08-31 MED ORDER — BUTAMBEN-TETRACAINE-BENZOCAINE 2-2-14 % EX AERO
INHALATION_SPRAY | CUTANEOUS | Status: DC | PRN
  Administered 2014-08-31: 2 via TOPICAL

## 2014-08-31 MED ORDER — MEPERIDINE HCL 50 MG/ML IJ SOLN
INTRAMUSCULAR | Status: AC
  Filled 2014-08-31: qty 1

## 2014-08-31 NOTE — H&P (Signed)
Kyle Gilbert is an 44 y.o. male.   Chief Complaint: Patient is here for EGD. HPI: Patient is 44 year old Caucasian male presents with history of intermittent heartburn. Within the last 2-3 months he has had 2 episodes of regurgitation and intractable heartburn and had to sit up at night. Said that OTC NSAIDs and tested did not help much. He recalls when he had bone marrow transplant for aplastic anemia in 1998 he was on Prilosec for several months. He does not remember was for heartburn or prophylactic purposes. He denies nausea vomiting dysphagia abdominal pain melena or rectal bleeding. He does not smoke cigarettes and drinks alcohol usually over the weekend no more than 8 drinks per weekend.  Past Medical History  Diagnosis Date  . GERD (gastroesophageal reflux disease)   . Hypertension   . High cholesterol   . H/o Aplastic anemia        ED.  Past Surgical History  Procedure Laterality Date  . 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   . Avascular necrosis      both hips   . Cataracts      bilateral.  . Vasectomy      History reviewed. No pertinent family history. Social History:  reports that he quit smoking about 15 years ago. His smoking use included Cigarettes. He has a 10 pack-year smoking history. He does not have any smokeless tobacco history on file. He reports that he drinks alcohol. He reports that he does not use illicit drugs.  Allergies:  Allergies  Allergen Reactions  . Benazepril     Medications Prior to Admission  Medication Sig Dispense Refill  . fexofenadine (ALLEGRA) 180 MG tablet Take 180 mg by mouth daily.    . fluticasone (VERAMYST) 27.5 MCG/SPRAY nasal spray Place 2 sprays into the nose daily.    Marland Kitchen losartan-hydrochlorothiazide (HYZAAR) 50-12.5 MG per tablet Take 1 tablet by mouth daily.    . sildenafil (REVATIO) 20 MG tablet Take 20 mg by mouth 3 (three) times daily.     . simvastatin (ZOCOR) 40 MG tablet Take 40 mg by mouth daily.      No results found for this or any previous visit (from the past 48 hour(s)). No results found.  ROS  Height  (1.753 m), weight 195 lb (88.451 kg). Physical Exam  Constitutional: He appears well-developed and well-nourished.  HENT:  Mouth/Throat: Oropharynx is clear and moist.  Eyes: Conjunctivae are normal. No scleral icterus.  Neck: No thyromegaly present.  Cardiovascular: Normal rate and regular rhythm.   No murmur heard. Respiratory: Effort normal and breath sounds normal.  GI: Soft. He exhibits no distension and no mass. There is no tenderness.  Musculoskeletal: He exhibits no edema.  Lymphadenopathy:    He has no cervical adenopathy.  Neurological: He is alert.  Skin: Skin is warm and dry.     Assessment/Plan Gastroesophageal reflux disease. Diagnostic esophagogastroduodenoscopy.  Tiras Bianchini U 08/31/2014, 12:55 PM

## 2014-08-31 NOTE — Discharge Instructions (Signed)
Resume usual medications and diet. Anti-reflux measures. No driving for 24 hours. Physician will call with biopsy results.   Esophagogastroduodenoscopy Care After Refer to this sheet in the next few weeks. These instructions provide you with information on caring for yourself after your procedure. Your caregiver may also give you more specific instructions. Your treatment has been planned according to current medical practices, but problems sometimes occur. Call your caregiver if you have any problems or questions after your procedure.  HOME CARE INSTRUCTIONS  Do not eat or drink anything until the numbing medicine (local anesthetic) has worn off and your gag reflex has returned. You will know that the local anesthetic has worn off when you can swallow comfortably.  Do not drive for 12 hours after the procedure or as directed by your caregiver.  Only take medicines as directed by your caregiver. SEEK MEDICAL CARE IF:   You cannot stop coughing.  You are not urinating at all or less than usual. SEEK IMMEDIATE MEDICAL CARE IF:  You have difficulty swallowing.  You cannot eat or drink.  You have worsening throat or chest pain.  You have dizziness, lightheadedness, or you faint.  You have nausea or vomiting.  You have chills.  You have a fever.  You have severe abdominal pain.  You have black, tarry, or bloody stools. Document Released: 12/16/2011 Document Reviewed: 12/16/2011 [facility] Patient Information 2015 [place], [place]. This information is not intended to replace advice given to you by your health care provider. Make sure you discuss any questions you have with your health care provider.  Gastroesophageal Reflux Disease, Adult Gastroesophageal reflux disease (GERD) happens when acid from your stomach flows up into the esophagus. When acid comes in contact with the esophagus, the acid causes soreness (inflammation) in the esophagus. Over time, GERD may create small holes  (ulcers) in the lining of the esophagus. CAUSES   Increased body weight. This puts pressure on the stomach, making acid rise from the stomach into the esophagus.  Smoking. This increases acid production in the stomach.  Drinking alcohol. This causes decreased pressure in the lower esophageal sphincter (valve or ring of muscle between the esophagus and stomach), allowing acid from the stomach into the esophagus.  Late evening meals and a full stomach. This increases pressure and acid production in the stomach.  A malformed lower esophageal sphincter. Sometimes, no cause is found. SYMPTOMS   Burning pain in the lower part of the mid-chest behind the breastbone and in the mid-stomach area. This may occur twice a week or more often.  Trouble swallowing.  Sore throat.  Dry cough.  Asthma-like symptoms including chest tightness, shortness of breath, or wheezing. DIAGNOSIS  Your caregiver may be able to diagnose GERD based on your symptoms. In some cases, X-rays and other tests may be done to check for complications or to check the condition of your stomach and esophagus. TREATMENT  Your caregiver may recommend over-the-counter or prescription medicines to help decrease acid production. Ask your caregiver before starting or adding any new medicines.  HOME CARE INSTRUCTIONS   Change the factors that you can control. Ask your caregiver for guidance concerning weight loss, quitting smoking, and alcohol consumption.  Avoid foods and drinks that make your symptoms worse, such as:  Caffeine or alcoholic drinks.  Chocolate.  Peppermint or mint flavorings.  Garlic and onions.  Spicy foods.  Citrus fruits, such as oranges, lemons, or limes.  Tomato-based foods such as sauce, chili, salsa, and pizza.  [place] and  fatty foods.  Avoid lying down for the 3 hours prior to your bedtime or prior to taking a nap.  Eat small, frequent meals instead of large meals.  Wear loose-fitting  clothing. Do not wear anything tight around your waist that causes pressure on your stomach.  Raise the head of your bed 6 to 8 inches with wood blocks to help you sleep. Extra pillows will not help.  Only take over-the-counter or prescription medicines for pain, discomfort, or fever as directed by your caregiver.  Do not take aspirin, ibuprofen, or other nonsteroidal anti-inflammatory drugs (NSAIDs). SEEK IMMEDIATE MEDICAL CARE IF:   You have pain in your arms, neck, jaw, teeth, or back.  Your pain increases or changes in intensity or duration.  You develop nausea, vomiting, or sweating (diaphoresis).  You develop shortness of breath, or you faint.  Your vomit is green, yellow, black, or looks like coffee grounds or blood.  Your stool is red, bloody, or black. These symptoms could be signs of other problems, such as heart disease, gastric bleeding, or esophageal bleeding. MAKE SURE YOU:   Understand these instructions.  Will watch your condition.  Will get help right away if you are not doing well or get worse. Document Released: 10/08/2004 Document Revised: 03/23/2011 Document Reviewed: 07/18/2010 [facility] Patient Information 2015 [place], [place]. This information is not intended to replace advice given to you by your health care provider. Make sure you discuss any questions you have with your health care provider.   Hiatal Hernia A hiatal hernia occurs when part of your stomach slides above the muscle that separates your abdomen from your chest (diaphragm). You can be born with a hiatal hernia (congenital), or it may develop over time. In almost all cases of hiatal hernia, only the top part of the stomach pushes through.  Many people have a hiatal hernia with no symptoms. The larger the hernia, the more likely that you will have symptoms. In some cases, a hiatal hernia allows stomach acid to flow back into the tube that carries food from your mouth to your stomach (esophagus).  This may cause heartburn symptoms. Severe heartburn symptoms may mean you have developed a condition called gastroesophageal reflux disease (GERD).  CAUSES  Hiatal hernias are caused by a weakness in the opening (hiatus) where your esophagus passes through your diaphragm to attach to the upper part of your stomach. You may be born with a weakness in your hiatus, or a weakness can develop. RISK FACTORS Older age is a major risk factor for a hiatal hernia. Anything that increases pressure on your diaphragm can also increase your risk of a hiatal hernia. This includes:  Pregnancy.  Excess weight.  Frequent constipation. SIGNS AND SYMPTOMS  People with a hiatal hernia often have no symptoms. If symptoms develop, they are almost always caused by GERD. They may include:  Heartburn.  Belching.  Indigestion.  Trouble swallowing.  Coughing or wheezing.  Sore throat.  Hoarseness.  Chest pain. DIAGNOSIS  A hiatal hernia is sometimes found during an exam for another problem. Your health care provider may suspect a hiatal hernia if you have symptoms of GERD. Tests may be done to diagnose GERD. These may include:  X-rays of your stomach or chest.  An upper gastrointestinal (GI) series. This is an X-ray exam of your GI tract involving the use of a chalky liquid that you swallow. The liquid shows up clearly on the X-ray.  Endoscopy. This is a procedure to look into your  stomach using a thin, flexible tube that has a tiny camera and light on the end of it. TREATMENT  If you have no symptoms, you may not need treatment. If you have symptoms, treatment may include:  Dietary and lifestyle changes to help reduce GERD symptoms.  Medicines. These may include:  Over-the-counter antacids.  Medicines that make your stomach empty more quickly.  Medicines that block the production of stomach acid (H2 blockers).  Stronger medicines to reduce stomach acid (proton pump inhibitors).  You may  need surgery to repair the hernia if other treatments are not helping. HOME CARE INSTRUCTIONS   Take all medicines as directed by your health care provider.  Quit smoking, if you smoke.  Try to achieve and maintain a healthy body weight.  Eat frequent small meals instead of three large meals a day. This keeps your stomach from getting too full.  Eat slowly.  Do not lie down right after eating.  Do noteat 1-2 hours before bed.   Do not drink beverages with caffeine. These include cola, coffee, cocoa, and tea.  Do not drink alcohol.  Avoid foods that can make symptoms of GERD worse. These may include:  Fatty foods.  Citrus fruits.  Other foods and drinks that contain acid.  Avoid putting pressure on your belly. Anything that puts pressure on your belly increases the amount of acid that may be pushed up into your esophagus.   Avoid bending over, especially after eating.  Raise the head of your bed by putting blocks under the legs. This keeps your head and esophagus higher than your stomach.  Do not wear tight clothing around your chest or stomach.  Try not to strain when having a bowel movement, when urinating, or when lifting heavy objects. SEEK MEDICAL CARE IF:  Your symptoms are not controlled with medicines or lifestyle changes.  You are having trouble swallowing.  You have coughing or wheezing that will not go away. SEEK IMMEDIATE MEDICAL CARE IF:  Your pain is getting worse.  Your pain spreads to your arms, neck, jaw, teeth, or back.  You have shortness of breath.  You sweat for no reason.  You feel sick to your stomach (nauseous) or vomit.  You vomit blood.  You have bright red blood in your stools.  You have black, tarry stools.  Document Released: 03/21/2003 Document Revised: 05/15/2013 Document Reviewed: 12/16/2012 [facility] Patient Information 2015 [place], [place]. This information is not intended to replace advice given to you by your health  care provider. Make sure you discuss any questions you have with your health care provider.

## 2014-08-31 NOTE — Op Note (Signed)
EGD PROCEDURE REPORT  PATIENT:  Kyle Gilbert  MR#:  [id number] Birthdate:  Feb 22, 1970, 44 y.o., male Endoscopist:  Dr. Malissa Hippo, MD Referred By:  Dr. Duane Lope, MD  Procedure Date: 08/31/2014  Procedure:   EGD  Indications:  Patient is 44 year old Caucasian male presents with history of intermittent heartburn knees had 2 episodes of bad nocturnal regurgitation with heartburn. He recalls he was on Prilosec for several months at the time and he had bone marrow transplant for aplastic anemia 1998. According to his wife he clears his throat all the time. He denies nausea vomiting dysphagia abdominal pain or melena.            Informed Consent:  The risks, benefits, alternatives & imponderables which include, but are not limited to, bleeding, infection, perforation, drug reaction and potential missed lesion have been reviewed.  The potential for biopsy, lesion removal, esophageal dilation, etc. have also been discussed.  Questions have been answered.  All parties agreeable.  Please see history & physical in medical record for more information.  Medications:  Demerol 50 mg IV Versed 8 mg IV Cetacaine spray topically for oropharyngeal anesthesia  Description of procedure:  The endoscope was introduced through the mouth and advanced to the second portion of the duodenum without difficulty or limitations. The mucosal surfaces were surveyed very carefully during advancement of the scope and upon withdrawal.  Findings:  Esophagus:  Mucosa of the esophagus was normal. GE junction was unremarkable. GEJ:  38 cm Hiatus:  40 cm Stomach:  Stomach was empty and distended very well with insufflation. Folds in the proximal stomach were normal. Examination of mucosa at gastric body was normal. Patchy antral erythema noted along with few petechiae and single erosion. Pyloric channel was patent. Tenderness fundus and cardia were unremarkable. Duodenum:  Normal bulbar and post bulbar  mucosa.  Therapeutic/Diagnostic Maneuvers Performed:   Antral biopsy taken for CLOtest.  Complications:  None  Impression: Small sliding hiatal hernia without evidence of erosive esophagitis. Antral gastritis and duodenitis. Antral biopsy taken for CLOtest.  Recommendations:  Anti-reflux measures reinforced. Pantoprazole 40 mg by mouth every morning. I will be contacting patient with results of CLOtest.  Arohi Salvatierra U  08/31/2014  1:32 PM  CC: Dr. Duane Lope & Dr. No ref. provider found

## 2014-09-01 LAB — CLOTEST (H. PYLORI), BIOPSY: Helicobacter screen: NEGATIVE — AB

## 2014-09-05 ENCOUNTER — Encounter (HOSPITAL_COMMUNITY): Payer: Self-pay | Admitting: Internal Medicine

## 2015-03-15 MED FILL — PANTOPRAZOLE SOD DR 40 MG T: 40 | 90 days supply | Qty: 90 | Fill #1

## 2015-03-15 MED FILL — LOSARTAN-HCTZ 50-12.5 MG TA: 50-12.5 | 90 days supply | Qty: 90 | Fill #2

## 2015-03-15 MED FILL — FLUTICASONE PROP 50 MCG SPR: 50 | 60 days supply | Qty: 16 | Fill #2

## 2015-06-27 MED FILL — FLUTICASONE PROP 50 MCG SPR: 50 | 90 days supply | Qty: 48 | Fill #0

## 2015-06-27 MED FILL — LOSARTAN-HCTZ 50-12.5 MG TA: 50-12.5 | 90 days supply | Qty: 90 | Fill #0

## 2015-08-13 ENCOUNTER — Other Ambulatory Visit: Payer: Self-pay | Admitting: Family Medicine

## 2015-08-13 DIAGNOSIS — F430 Acute stress reaction: Secondary | ICD-10-CM | POA: Diagnosis not present

## 2015-08-13 DIAGNOSIS — Z800 Family history of malignant neoplasm of digestive organs: Secondary | ICD-10-CM

## 2015-08-13 DIAGNOSIS — E7800 Pure hypercholesterolemia, unspecified: Secondary | ICD-10-CM | POA: Diagnosis not present

## 2015-09-17 ENCOUNTER — Ambulatory Visit
Admission: RE | Admit: 2015-09-17 | Discharge: 2015-09-17 | Disposition: A | Discharge status: Home / Self Care | Payer: 59 | Source: Ambulatory Visit | Attending: Family Medicine | Admitting: Family Medicine

## 2015-09-17 DIAGNOSIS — Z800 Family history of malignant neoplasm of digestive organs: Secondary | ICD-10-CM

## 2015-09-17 DIAGNOSIS — K7689 Other specified diseases of liver: Secondary | ICD-10-CM | POA: Diagnosis not present

## 2015-10-07 MED FILL — LOSARTAN-HCTZ 50-12.5 MG TA: 50-12.5 | 90 days supply | Qty: 90 | Fill #0

## 2015-11-01 DIAGNOSIS — H25049 Posterior subcapsular polar age-related cataract, unspecified eye: Secondary | ICD-10-CM | POA: Diagnosis not present

## 2015-11-01 DIAGNOSIS — H5213 Myopia, bilateral: Secondary | ICD-10-CM | POA: Diagnosis not present

## 2015-11-14 DIAGNOSIS — E291 Testicular hypofunction: Secondary | ICD-10-CM | POA: Diagnosis not present

## 2015-11-14 DIAGNOSIS — D619 Aplastic anemia, unspecified: Secondary | ICD-10-CM | POA: Diagnosis not present

## 2015-11-14 DIAGNOSIS — E7800 Pure hypercholesterolemia, unspecified: Secondary | ICD-10-CM | POA: Diagnosis not present

## 2015-11-14 DIAGNOSIS — Z0000 Encounter for general adult medical examination without abnormal findings: Secondary | ICD-10-CM | POA: Diagnosis not present

## 2015-11-14 DIAGNOSIS — I10 Essential (primary) hypertension: Secondary | ICD-10-CM | POA: Diagnosis not present

## 2015-12-30 MED FILL — FLUTICASONE PROP 50 MCG SPR: 50 | 90 days supply | Qty: 32 | Fill #0

## 2015-12-30 MED FILL — LOSARTAN-HCTZ 50-12.5 MG TA: 50-12.5 | 90 days supply | Qty: 90 | Fill #0

## 2016-04-22 DIAGNOSIS — N451 Epididymitis: Secondary | ICD-10-CM | POA: Diagnosis not present

## 2016-04-22 MED FILL — levoFLOXacin 500 MG TABS: 500 | 10 days supply | Qty: 10 | Fill #0

## 2016-05-04 MED FILL — LOSARTAN-HCTZ 50-12.5 MG TA: 50-12.5 | 90 days supply | Qty: 90 | Fill #1

## 2016-05-25 DIAGNOSIS — N509 Disorder of male genital organs, unspecified: Secondary | ICD-10-CM | POA: Diagnosis not present

## 2016-08-07 MED FILL — LOSARTAN-HCTZ 50-12.5 MG TA: 50-12.5 | 90 days supply | Qty: 90 | Fill #0

## 2016-10-16 DIAGNOSIS — I10 Essential (primary) hypertension: Secondary | ICD-10-CM | POA: Diagnosis not present

## 2016-10-16 DIAGNOSIS — Z23 Encounter for immunization: Secondary | ICD-10-CM | POA: Diagnosis not present

## 2016-10-16 MED FILL — LOSARTAN-HCTZ 100-12.5 MG T: 100-12.5 | 90 days supply | Qty: 90 | Fill #0

## 2017-02-08 MED FILL — LOSARTAN-HCTZ 100-12.5 MG T: 100-12.5 | 90 days supply | Qty: 90 | Fill #1

## 2017-05-17 MED FILL — LOSARTAN-HCTZ 100-12.5 MG T: 100-12.5 | 90 days supply | Qty: 90 | Fill #2

## 2017-08-27 MED FILL — LOSARTAN-HCTZ 100-12.5 MG T: 100-12.5 | 30 days supply | Qty: 30 | Fill #0

## 2017-08-27 MED FILL — SILDENAFIL 20 MG TABLET: 20 | 30 days supply | Qty: 50 | Fill #0

## 2017-09-08 LAB — HEMOGLOBIN A1C: Hemoglobin A1C: 5.5

## 2017-09-09 LAB — BASIC METABOLIC PANEL
BUN: 17 (ref 4–21)
Creatinine: 0.9 (ref 0.6–1.3)
Glucose: 108
Potassium: 4.3 (ref 3.4–5.3)
Sodium: 139 (ref 137–147)

## 2017-09-09 LAB — HEPATIC FUNCTION PANEL
ALT: 22 (ref 10–40)
AST: 18 (ref 14–40)
Bilirubin, Total: 0.7

## 2017-09-09 LAB — LIPID PANEL
Cholesterol: 188 (ref 0–200)
HDL: 37 (ref 35–70)
LDL Cholesterol: 128
Triglycerides: 113 (ref 40–160)

## 2017-09-09 LAB — CBC AND DIFFERENTIAL
HCT: 43 (ref 41–53)
Hemoglobin: 14.5 (ref 13.5–17.5)

## 2017-09-29 MED FILL — LOSARTAN-HCTZ 100-12.5 MG T: 100-12.5 | 90 days supply | Qty: 90 | Fill #0

## 2018-01-03 MED FILL — LOSARTAN-HCTZ 100-12.5 MG T: 100-12.5 | 90 days supply | Qty: 90 | Fill #1

## 2018-01-23 DIAGNOSIS — H66002 Acute suppurative otitis media without spontaneous rupture of ear drum, left ear: Secondary | ICD-10-CM | POA: Diagnosis not present

## 2018-02-03 ENCOUNTER — Encounter (HOSPITAL_COMMUNITY): Payer: Self-pay | Admitting: Internal Medicine

## 2018-02-03 ENCOUNTER — Encounter: Payer: Self-pay | Admitting: Physician Assistant

## 2018-02-03 ENCOUNTER — Ambulatory Visit (INDEPENDENT_AMBULATORY_CARE_PROVIDER_SITE_OTHER): Payer: 59 | Admitting: Physician Assistant

## 2018-02-03 VITALS — BP 140/90 | HR 61 | Temp 98.7°F | Ht 70.5 in | Wt 211.5 lb

## 2018-02-03 DIAGNOSIS — Z23 Encounter for immunization: Secondary | ICD-10-CM

## 2018-02-03 DIAGNOSIS — H6690 Otitis media, unspecified, unspecified ear: Secondary | ICD-10-CM

## 2018-02-03 MED ORDER — CEFDINIR 300 MG PO CAPS: 300.0000 mg | ORAL_CAPSULE | Freq: Two times a day (BID) | ORAL | 0 refills | Status: AC

## 2018-02-03 MED ORDER — PREDNISONE 20 MG PO TABS: 40.0000 mg | ORAL_TABLET | Freq: Every day | ORAL | 0 refills | Status: AC

## 2018-02-03 MED FILL — predniSONE 20 MG TABS: 20 | 5 days supply | Qty: 10 | Fill #0

## 2018-02-03 MED FILL — CEFDINIR 300 MG CAPSULE: 300 | 5 days supply | Qty: 10 | Fill #0

## 2018-02-03 NOTE — Progress Notes (Signed)
Kyle Gilbert is a 48 y.o. male here to Establish Care.  I acted as a Neurosurgeonscribe for Energy East CorporationSamantha Ayomide Purdy, PA-C Corky Mullonna Orphanos, LPN  History of Present Illness:   Chief Complaint  Patient presents with  . Establish Care  . Left ear plugged    x 2 weeks    Acute Concerns: L ear issues -- two weeks ago developed sharp L ear pain and fullness, no fever. No travel. Went to clinic at [facility] and was put on antibiotic, Augmentin. Symptoms are unchanged.   Health Maintenance: Immunizations -- will give Flu and Tdap today. Colonoscopy -- N/A Mammogram -- N/A Bone Density -- N/A PSA -- N/A Weight -- Weight: 211 lb 8 oz (95.9 kg)   [facility]  Decreased Interest 0  Down, Depressed, Hopeless 0  PHQ - 2 Score 0    No flowsheet data found.  Other providers/specialists: Patient Care Team: Jarold MottoWorley, Emalynn Clewis, [place] as PCP - General (Physician Assistant)   Past Medical History:  Diagnosis Date  . Aplastic anemia (HCC) 1998   at age 48, started having fatigue and bruising, was found on blood work, had BMT  . Blood transfusion without reported diagnosis    Due to Aplastic Anemia  . Hyperlipidemia    could not tolerate statin  . Hypertension    Losartan 100 mg, well controlled  . Parathyroid abnormality (HCC)    ?hyper vs hypo; removed around 2005     Social History   Socioeconomic History  . Marital status: Not on file    Spouse name: Not on file  . Number of children: Not on file  . Years of education: Not on file  . Highest education level: Not on file  Occupational History  . Not on file  Social Needs  . Financial resource strain: Not on file  . Food insecurity:    Worry: Not on file    Inability: Not on file  . Transportation needs:    Medical: Not on file    Non-medical: Not on file  Tobacco Use  . Smoking status: Former Smoker    Types: Cigarettes    Last attempt to quit: 01/13/2003    Years since quitting: 15.0  . Smokeless tobacco: Never Used   Substance and Sexual Activity  . Alcohol use: Yes    Alcohol/week: 6.0 standard drinks    Types: 6 Cans of beer per week    Comment: weekend  . Drug use: Never  . Sexual activity: Yes  Lifestyle  . Physical activity:    Days per week: Not on file    Minutes per session: Not on file  . Stress: Not on file  Relationships  . Social connections:    Talks on phone: Not on file    Gets together: Not on file    Attends religious service: Not on file    Active member of club or organization: Not on file    Attends meetings of clubs or organizations: Not on file    Relationship status: Not on file  . Intimate partner violence:    Fear of current or ex partner: Not on file    Emotionally abused: Not on file    Physically abused: Not on file    Forced sexual activity: Not on file  Other Topics Concern  . 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 BONE MARROW TRANSPLANT  1998   Aplastic Anemia  . VASECTOMY  2016    Family History  Problem Relation Age of Onset  . Breast cancer Mother   . Hyperlipidemia Mother   . Hypertension Mother   . Pancreatic cancer Father   . Hypertension Father   . Lung cancer Maternal Grandmother   . Lung cancer Maternal Grandfather   . Pancreatic cancer Paternal Grandmother   . Pancreatic cancer Paternal Grandfather   . Colon cancer Neg Hx     No Known Allergies   Current Medications:   Current Outpatient Medications:  .  Coenzyme Q10 (CO Q-10) 200 MG CAPS, Take 1 capsule by mouth daily., Disp: , Rfl:  .  losartan (COZAAR) 100 MG tablet, Take 100 mg by mouth daily., Disp: , Rfl:  .  naproxen sodium (ALEVE) 220 MG tablet, Take 220 mg by mouth as needed., Disp: , Rfl:  .  Red Yeast Rice 600 MG CAPS, Take 2 capsules by mouth daily., Disp: , Rfl:  .  vitamin B-12 (CYANOCOBALAMIN) 250 MCG tablet, Take 250  mcg by mouth daily., Disp: , Rfl:  .  cefdinir (OMNICEF) 300 MG capsule, Take 1 capsule (300 mg total) by mouth 2 (two) times daily for 5 days., Disp: 10 capsule, Rfl: 0 .  predniSONE (DELTASONE) 20 MG tablet, Take 2 tablets (40 mg total) by mouth daily., Disp: 10 tablet, Rfl: 0   Review of Systems:   ROS  Negative unless otherwise specified per HPI.  Vitals:   Vitals:   02/03/18 0941  BP: 140/90  Pulse: 61  Temp: 98.7 F (37.1 C)  TempSrc: Oral  SpO2: 97%  Weight: 211 lb 8 oz (95.9 kg)  Height: 5' 10.5" (1.791 m)      Body mass index is 29.92 kg/m.  Physical Exam:   Physical Exam Vitals signs and nursing note reviewed.  Constitutional:      General: He is not in acute distress.    Appearance: He is well-developed. He is not ill-appearing or toxic-appearing.  HENT:     Head: Normocephalic and atraumatic.     Right Ear: Tympanic membrane, ear canal and external ear normal. Tympanic membrane is not erythematous, retracted or bulging.     Left Ear: Ear canal and external ear normal. A middle ear effusion (yellow fluid) is present. Tympanic membrane is erythematous.     Nose: Nose normal.     Right Sinus: No maxillary sinus tenderness or frontal sinus tenderness.     Left Sinus: No maxillary sinus tenderness or frontal sinus tenderness.     Mouth/Throat:     Pharynx: Uvula midline. Posterior oropharyngeal erythema present.  Eyes:     General: Lids are normal.     Conjunctiva/sclera: Conjunctivae normal.  Neck:     Trachea: Trachea normal.  Cardiovascular:     Rate and Rhythm: Normal rate and regular rhythm.     Heart sounds: Normal heart sounds, S1 normal and S2 normal.  Pulmonary:     Effort: Pulmonary effort is normal.     Breath sounds: Normal breath sounds. No decreased breath sounds, wheezing, rhonchi or rales.  Lymphadenopathy:     Cervical: No cervical adenopathy.  Skin:    General: Skin is warm and dry.  Neurological:     Mental Status: He is alert.   Psychiatric:        Speech: Speech normal.  Behavior: Behavior normal. Behavior is cooperative.       No results found for this or any previous visit.  Assessment and Plan:   Calyn was seen today for establish care and left ear plugged.  Diagnoses and all orders for this visit:  Acute otitis media, unspecified otitis media type No red flags on exam.  Will initiate Omnicef and oral prednisone per orders. Discussed taking medications as prescribed. Reviewed return precautions including worsening fever, SOB, worsening cough or other concerns. Push fluids and rest. I recommend that patient follow-up if symptoms worsen or persist despite treatment x 7-10 days, sooner if needed.  Need for prophylactic vaccination and inoculation against influenza -     Flu Vaccine QUAD 36+ mos IM  Need for prophylactic vaccination with combined diphtheria-tetanus-pertussis (DTP) vaccine -     Tdap vaccine greater than or equal to 7yo IM  Other orders -     cefdinir (OMNICEF) 300 MG capsule; Take 1 capsule (300 mg total) by mouth 2 (two) times daily for 5 days. -     predniSONE (DELTASONE) 20 MG tablet; Take 2 tablets (40 mg total) by mouth daily.  . Reviewed expectations re: course of current medical issues. . Discussed self-management of symptoms. . Outlined signs and symptoms indicating need for more acute intervention. . Patient verbalized understanding and all questions were answered. . See orders for this visit as documented in the electronic medical record. . Patient received an After-Visit Summary.  CMA or LPN served as scribe during this visit. History, Physical, and Plan performed by medical provider. The above documentation has been reviewed and is accurate and complete.  Jarold Motto, PA-C

## 2018-02-03 NOTE — Patient Instructions (Signed)
It was great to see you!  Start new antibiotic, Omnicef, and take oral prednisone  Push fluids and get plenty of rest. Please return if you are not improving as expected, or if you have high fevers (>101.5) or difficulty swallowing or worsening productive cough.  Call clinic with questions.  I hope you start feeling better soon!

## 2018-02-09 ENCOUNTER — Encounter: Payer: Self-pay | Admitting: Physician Assistant

## 2018-03-31 DIAGNOSIS — Z0000 Encounter for general adult medical examination without abnormal findings: Secondary | ICD-10-CM | POA: Diagnosis not present

## 2018-03-31 DIAGNOSIS — I10 Essential (primary) hypertension: Secondary | ICD-10-CM | POA: Diagnosis not present

## 2018-03-31 DIAGNOSIS — R198 Other specified symptoms and signs involving the digestive system and abdomen: Secondary | ICD-10-CM | POA: Diagnosis not present

## 2018-03-31 DIAGNOSIS — Z862 Personal history of diseases of the blood and blood-forming organs and certain disorders involving the immune mechanism: Secondary | ICD-10-CM | POA: Diagnosis not present

## 2018-03-31 DIAGNOSIS — R7303 Prediabetes: Secondary | ICD-10-CM | POA: Diagnosis not present

## 2018-04-01 ENCOUNTER — Other Ambulatory Visit: Payer: Self-pay | Admitting: Family Medicine

## 2018-04-01 DIAGNOSIS — R198 Other specified symptoms and signs involving the digestive system and abdomen: Secondary | ICD-10-CM

## 2018-04-01 MED FILL — LOSARTAN POTASSIUM 100 MG T: 100 | 90 days supply | Qty: 90 | Fill #0

## 2018-04-01 MED FILL — HYDROCHLOROTHIAZIDE 12.5 MG: 12.5 | 90 days supply | Qty: 90 | Fill #0

## 2018-04-04 DIAGNOSIS — Z13220 Encounter for screening for lipoid disorders: Secondary | ICD-10-CM | POA: Diagnosis not present

## 2018-04-04 DIAGNOSIS — Z0000 Encounter for general adult medical examination without abnormal findings: Secondary | ICD-10-CM | POA: Diagnosis not present

## 2018-04-04 DIAGNOSIS — Z862 Personal history of diseases of the blood and blood-forming organs and certain disorders involving the immune mechanism: Secondary | ICD-10-CM | POA: Diagnosis not present

## 2018-04-04 DIAGNOSIS — R7303 Prediabetes: Secondary | ICD-10-CM | POA: Diagnosis not present

## 2018-04-05 ENCOUNTER — Ambulatory Visit
Admission: RE | Admit: 2018-04-05 | Discharge: 2018-04-05 | Disposition: A | Discharge status: Home / Self Care | Payer: 59 | Source: Ambulatory Visit | Attending: Family Medicine | Admitting: Family Medicine

## 2018-04-05 DIAGNOSIS — R198 Other specified symptoms and signs involving the digestive system and abdomen: Secondary | ICD-10-CM | POA: Diagnosis not present

## 2018-06-27 MED FILL — LOSARTAN POTASSIUM 100 MG T: 100 | 90 days supply | Qty: 90 | Fill #1

## 2018-06-27 MED FILL — HYDROCHLOROTHIAZIDE 12.5 MG: 12.5 | 90 days supply | Qty: 90 | Fill #1

## 2018-06-27 MED FILL — SILDENAFIL CITRATE 20 MG TA: 20 | 30 days supply | Qty: 50 | Fill #1

## 2018-07-11 DIAGNOSIS — H5213 Myopia, bilateral: Secondary | ICD-10-CM | POA: Diagnosis not present

## 2018-10-10 MED FILL — LOSARTAN POTASSIUM 100 MG T: 100 | 90 days supply | Qty: 90 | Fill #2

## 2018-10-10 MED FILL — HYDROCHLOROTHIAZIDE 12.5 MG: 12.5 | 90 days supply | Qty: 90 | Fill #2

## 2018-10-10 MED FILL — SILDENAFIL CITRATE 20 MG TA: 20 | 30 days supply | Qty: 50 | Fill #0

## 2018-11-17 DIAGNOSIS — Z23 Encounter for immunization: Secondary | ICD-10-CM | POA: Diagnosis not present

## 2018-11-17 DIAGNOSIS — B958 Unspecified staphylococcus as the cause of diseases classified elsewhere: Secondary | ICD-10-CM | POA: Diagnosis not present

## 2018-11-17 DIAGNOSIS — J3489 Other specified disorders of nose and nasal sinuses: Secondary | ICD-10-CM | POA: Diagnosis not present

## 2018-11-17 MED FILL — MUPIROCIN 2% OINTMENT: 2 | 10 days supply | Qty: 22 | Fill #0

## 2019-01-23 MED FILL — SILDENAFIL CITRATE 20 MG TA: 20 | 30 days supply | Qty: 50 | Fill #0

## 2019-01-23 MED FILL — LOSARTAN POTASSIUM 100 MG T: 100 | 90 days supply | Qty: 90 | Fill #3

## 2019-01-23 MED FILL — HYDROCHLOROTHIAZIDE 12.5 MG: 12.5 | 90 days supply | Qty: 90 | Fill #3

## 2019-04-03 ENCOUNTER — Other Ambulatory Visit (HOSPITAL_COMMUNITY): Payer: Self-pay | Admitting: Family Medicine

## 2019-04-03 DIAGNOSIS — Z0000 Encounter for general adult medical examination without abnormal findings: Secondary | ICD-10-CM | POA: Diagnosis not present

## 2019-04-03 DIAGNOSIS — E7800 Pure hypercholesterolemia, unspecified: Secondary | ICD-10-CM | POA: Diagnosis not present

## 2019-04-03 DIAGNOSIS — Z862 Personal history of diseases of the blood and blood-forming organs and certain disorders involving the immune mechanism: Secondary | ICD-10-CM | POA: Diagnosis not present

## 2019-04-03 DIAGNOSIS — N529 Male erectile dysfunction, unspecified: Secondary | ICD-10-CM | POA: Diagnosis not present

## 2019-04-03 DIAGNOSIS — E291 Testicular hypofunction: Secondary | ICD-10-CM | POA: Diagnosis not present

## 2019-04-03 DIAGNOSIS — Z800 Family history of malignant neoplasm of digestive organs: Secondary | ICD-10-CM | POA: Diagnosis not present

## 2019-04-03 DIAGNOSIS — I10 Essential (primary) hypertension: Secondary | ICD-10-CM | POA: Diagnosis not present

## 2019-04-03 DIAGNOSIS — R7303 Prediabetes: Secondary | ICD-10-CM | POA: Diagnosis not present

## 2019-04-03 DIAGNOSIS — J309 Allergic rhinitis, unspecified: Secondary | ICD-10-CM | POA: Diagnosis not present

## 2019-04-03 MED FILL — FLUTICASONE PROP 50 MCG SPR: 50 | 60 days supply | Qty: 16 | Fill #0

## 2019-04-03 MED FILL — SILDENAFIL CITRATE 20 MG TA: 20 | 30 days supply | Qty: 50 | Fill #0

## 2019-04-03 MED FILL — LOSARTAN-HCTZ 100-12.5 MG T: 100-12.5 | 90 days supply | Qty: 90 | Fill #0

## 2019-04-06 MED FILL — ROSUVASTATIN CALCIUM 20 MG: 20 | 90 days supply | Qty: 90 | Fill #0

## 2019-04-20 DIAGNOSIS — M25562 Pain in left knee: Secondary | ICD-10-CM | POA: Diagnosis not present

## 2019-04-20 MED FILL — NITROGLYCERIN 0.2 MG/HR PTC: 0.2 | 30 days supply | Qty: 8 | Fill #0

## 2019-05-24 ENCOUNTER — Ambulatory Visit (HOSPITAL_COMMUNITY): Payer: 59 | Attending: Family Medicine | Admitting: Physical Therapy

## 2019-05-24 ENCOUNTER — Other Ambulatory Visit: Payer: Self-pay

## 2019-05-24 ENCOUNTER — Encounter (HOSPITAL_COMMUNITY): Payer: Self-pay | Admitting: Physical Therapy

## 2019-05-24 DIAGNOSIS — G8929 Other chronic pain: Secondary | ICD-10-CM | POA: Diagnosis not present

## 2019-05-24 DIAGNOSIS — M6281 Muscle weakness (generalized): Secondary | ICD-10-CM | POA: Insufficient documentation

## 2019-05-24 DIAGNOSIS — M25562 Pain in left knee: Secondary | ICD-10-CM | POA: Diagnosis not present

## 2019-05-24 NOTE — Therapy (Signed)
[facility] [address] [place], [place], [address] Phone: [phone]   Fax:  [phone]  Physical Therapy Evaluation  Patient Details  Name: Kyle Gilbert MRN: [id number] Date of Birth: 10/20/1970 Referring Provider (PT): Gildardo Cranker   Encounter Date: 05/24/2019  PT End of Session - 05/24/19 1023    Visit Number  1    Number of Visits  8    Date for PT Re-Evaluation  07/05/19    Authorization Type  [place] [employer] - No VL, auth required at 25th visit    Authorization - Visit Number  1    Authorization - Number of Visits  25    Progress Note Due on Visit  10    PT Start Time  0830    PT Stop Time  0915    PT Time Calculation (min)  45 min    Activity Tolerance  Patient tolerated treatment well    Behavior During Therapy  [facility] for tasks assessed/performed       Past Medical History:  Diagnosis Date  . Aplastic anemia (HCC)   . Aplastic anemia (HCC) 1998   at age 89, started having fatigue and bruising, was found on blood work, had BMT  . Blood transfusion without reported diagnosis    Due to Aplastic Anemia  . GERD (gastroesophageal reflux disease)   . High cholesterol   . Hyperlipidemia    could not tolerate statin  . Hypertension   . Hypertension    Losartan 100 mg, well controlled  . Parathyroid abnormality (HCC)    ?hyper vs hypo; removed around 2005    Past Surgical History:  Procedure Laterality Date  . avascular necrosis     both hips   . BONE MARROW TRANSPLANT     1998 in [place] at [facility]  . BONE MARROW TRANSPLANT  1998   Aplastic Anemia  . cataracts     bilateral.  . ESOPHAGOGASTRODUODENOSCOPY N/A 08/31/2014   Procedure: ESOPHAGOGASTRODUODENOSCOPY (EGD);  Surgeon: Malissa Hippo, MD;  Location: [facility];  Service: Endoscopy;  Laterality: N/A;  730 -moved to 8/19 @ 12:00  . PARATHYROIDECTOMY     2006 or 2007 for high levels.   Marland Kitchen VASECTOMY    . VASECTOMY  2016    There were no vitals  filed for this visit.   Subjective Assessment - 05/24/19 1018    Subjective  Patient complains of knee pain that started years ago. Noticed it would be better/worse at times. States that a couple months ago he was walking when he had a sharp pain in his left knee that stopped him in his tracks. Reports he almost thought he was going to have to be [place] the [place]. States that his knee felt weird that day/evening. Reports typically he works out and does body pump once a week and lunges and squats really bother him. Going down stairs also bothers him and he is always just cautious and careful of walking around. 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 States that pain comes and goes and feels deep in his knees. Reports today it is feeling pretty good.    Pertinent History  bilateral avascular necrosis of hips    Patient Stated Goals  to have less pain    Currently in Pain?  Yes    Pain Score  1  Pain Location  Knee    Pain Orientation  Left;Anterior    Pain Type  Chronic pain    Pain Onset  More than a month ago    Pain Frequency  Intermittent    Aggravating Factors   squats, lunges, stairs    Pain Relieving Factors  rest         [facility] PT Assessment - 05/24/19 0001      Assessment   Medical Diagnosis  left knee pain    Referring Provider (PT)  Gildardo Cranker    Prior Therapy  yes      Precautions   Precautions  None      Restrictions   Weight Bearing Restrictions  No      Balance Screen   Has the patient fallen in the past 6 months  No      Home Environment   Living Environment  Private residence    Available Help at Discharge  Family    Type of Home  House    Home Access  Stairs to enter      Prior Function   Level of Independence  Independent      Cognition   Overall Cognitive Status  Within Functional Limits for tasks assessed      Observation/Other Assessments    Observations  no swelling noted in left knee, patella alta Bilaterally; shoes  worn out along lateral heels and foot - indicative of supinator with walking     Focus on Therapeutic Outcomes (FOTO)   75.6% function, 24.4% limited      ROM / Strength   AROM / PROM / Strength  AROM;Strength      AROM   AROM Assessment Site  Knee;Hip;Ankle    Right/Left Hip  Right;Left    Right Hip Extension  10    Right Hip External Rotation   45    Right Hip Internal Rotation   30    Left Hip Extension  10    Left Hip External Rotation   45    Left Hip Internal Rotation   45    Right/Left Knee  Right;Left    Right Knee Extension  0    Right Knee Flexion  130    Left Knee Extension  130    Left Knee Flexion  0    Right/Left Ankle  Right;Left    Right Ankle Dorsiflexion  5    Left Ankle Dorsiflexion  2      Strength   Strength Assessment Site  Knee;Hip;Ankle    Right/Left Hip  Right;Left    Right Hip Extension  4/5    Right Hip ABduction  4+/5    Right Hip ADduction  4+/5    Left Hip Extension  4/5    Left Hip ABduction  4+/5    Left Hip ADduction  4+/5    Right/Left Knee  Right;Left    Right Knee Flexion  4+/5    Right Knee Extension  5/5    Left Knee Flexion  4+/5    Left Knee Extension  5/5    Right/Left Ankle  Right;Left    Right Ankle Dorsiflexion  5/5    Right Ankle Plantar Flexion  5/5   20 SL heel raises   Left Ankle Dorsiflexion  5/5    Left Ankle Plantar Flexion  5/5   20 SL heel raises     Palpation   Patella mobility  WNL    Palpation comment  tenderness along medial  aspect of knee (deep in knee), no pain along joint line      Special Tests   Other special tests  step down test - Left -limited knee control, pain and increased medial/lateral knee movement, ankle in eversion and toed outward; no difficultes of pain on R      Transfers   Comments  squat observation - anterior weight shift, excessive tibial translation, collapse of trunk (excessive hip flexion) feet collapse  into eversion.       Ambulation/Gait   Stairs  Yes    Stair Management Technique  Alternating pattern;No rails    Gait Comments  pain in left knee with descent of stairs                 Objective measurements completed on examination: See above findings.              PT Education - 05/24/19 1027    Education Details  Educated patient on current condition, presentation and plan moving forward. Educated patient and discussed compound movements and compensatory strategies observed. Cued patient to perform compound movements (squats/ lunges) like "an elevator" discussed change in center of mass with anterior shift and line of force through the knee in different positions.    Person(s) Educated  Patient    Methods  Explanation;Demonstration    Comprehension  Verbalized understanding;Returned demonstration;Verbal cues required;Tactile cues required       PT Short Term Goals - 05/24/19 1027      PT SHORT TERM GOAL #1   Title  Patient will be independent in self management strategies to improve quality of life and functional outcomes.    Time  3    Period  Weeks    Status  New    Target Date  06/14/19      PT SHORT TERM GOAL #2   Title  Patient will be able to demonstrate a squat with good form, no knee pain and at least to depth of 60 degrees to demonstrate improved functional strength    Time  3    Period  Weeks    Status  New    Target Date  06/14/19        PT Long Term Goals - 05/24/19 1029      PT LONG TERM GOAL #1   Title  Patient will be able to perform step down test without pain and improved knee control (decreased medial/lateral movement of the knee)    Time  6    Period  Weeks    Status  New    Target Date  07/05/19      PT LONG TERM GOAL #2   Title  Patient will report at least 50% improvement in overall symptoms and/or functional ability.    Time  6    Period  Weeks    Status  New    Target Date  07/05/19             Plan -  05/24/19 1024    Clinical Impression Statement  Patient presents to therapy with complaints of chronic left knee pain. Patient presents with good isolated strength but decreased functional strength and poor mechanics with functional movements (squats, stairs). Educated patient on compensatory movement patterns and how to correct them. No pain noted with squat when patient performed with proper mechanics. Patient instructed to focus on mechanics with functional movements and to try to decrease compensatory patterns he was educated on today. Patient would greatly benefit from  skilled physical therapy focusing on correcting functional movement patterns to decrease pain and improve overall function.    Personal Factors and Comorbidities  Comorbidity 1    Comorbidities  bilateral avascular necrosis of hips    Examination-Activity Limitations  Stairs;Squat;Bend    Examination-Participation Restrictions  Community Activity    Stability/Clinical Decision Making  Stable/Uncomplicated    Clinical Decision Making  Low    Rehab Potential  Good    PT Frequency  Other (comment)   1-2x/week for total of 8 visits over 6 week certification period   PT Duration  6 weeks    PT Treatment/Interventions  ADLs/Self Care Home Management;Aquatic Therapy;Cryotherapy;Electrical Stimulation;Traction;Balance training;Moist Heat;Therapeutic exercise;Therapeutic activities;Iontophoresis 4mg /ml Dexamethasone;Stair training;Gait training;Neuromuscular re-education;Patient/family education;Manual techniques;Dry needling;Passive range of motion;Joint Manipulations    PT Next Visit Plan  functional movement assessment and mechanics education (lunges, squats, stairs)- modify for pain free movement with cues and within painfree ROM    PT Home Exercise Plan  5/11 focus on squat form       Patient will benefit from skilled therapeutic intervention in order to improve the following deficits and impairments:  Pain, Decreased strength,  Decreased mobility, Decreased activity tolerance  Visit Diagnosis: Chronic pain of left knee  Muscle weakness (generalized)     Problem List Patient Active Problem List   Diagnosis Date Noted  . Essential hypertension 08/06/2014  . High cholesterol 08/06/2014  . Aplastic anemia (HCC) 08/06/2014  . Pain of left heel 10/11/2012   11:58 AM, 05/24/19 07/24/19, DPT Physical Therapy with [facility]  [phone] office  [facility] [address] [place], [place], [place] Phone: [phone]   Fax:  [phone]  Name: Kyle Gilbert MRN: Lady Gary Date of Birth: 1970/05/18

## 2019-05-31 ENCOUNTER — Ambulatory Visit (HOSPITAL_COMMUNITY): Payer: 59 | Admitting: Physical Therapy

## 2019-05-31 ENCOUNTER — Encounter (HOSPITAL_COMMUNITY): Payer: Self-pay | Admitting: Physical Therapy

## 2019-05-31 ENCOUNTER — Other Ambulatory Visit: Payer: Self-pay

## 2019-05-31 DIAGNOSIS — G8929 Other chronic pain: Secondary | ICD-10-CM | POA: Diagnosis not present

## 2019-05-31 DIAGNOSIS — M25562 Pain in left knee: Secondary | ICD-10-CM

## 2019-05-31 DIAGNOSIS — M6281 Muscle weakness (generalized): Secondary | ICD-10-CM | POA: Diagnosis not present

## 2019-05-31 NOTE — Therapy (Signed)
[facility] [address] [place], [place], [address] Phone: [phone]   Fax:  [phone]  Physical Therapy Treatment  Patient Details  Name: Kyle Gilbert MRN: [id number] Date of Birth: 07-19-1970 Referring Provider (PT): Gildardo Cranker   Encounter Date: 05/31/2019  PT End of Session - 05/31/19 0955    Visit Number  2    Number of Visits  8    Date for PT Re-Evaluation  07/05/19    Authorization Type  [place] [employer] - No VL, auth required at 25th visit    Authorization - Visit Number  2    Authorization - Number of Visits  25    Progress Note Due on Visit  10    PT Start Time  0950    PT Stop Time  1030    PT Time Calculation (min)  40 min    Activity Tolerance  Patient tolerated treatment well    Behavior During Therapy  [facility] for tasks assessed/performed       Past Medical History:  Diagnosis Date  . Aplastic anemia (HCC)   . Aplastic anemia (HCC) 1998   at age 49, started having fatigue and bruising, was found on blood work, had BMT  . Blood transfusion without reported diagnosis    Due to Aplastic Anemia  . GERD (gastroesophageal reflux disease)   . High cholesterol   . Hyperlipidemia    could not tolerate statin  . Hypertension   . Hypertension    Losartan 100 mg, well controlled  . Parathyroid abnormality (HCC)    ?hyper vs hypo; removed around 2005    Past Surgical History:  Procedure Laterality Date  . avascular necrosis     both hips   . BONE MARROW TRANSPLANT     1998 in [place] at [facility]  . BONE MARROW TRANSPLANT  1998   Aplastic Anemia  . cataracts     bilateral.  . ESOPHAGOGASTRODUODENOSCOPY N/A 08/31/2014   Procedure: ESOPHAGOGASTRODUODENOSCOPY (EGD);  Surgeon: Malissa Hippo, MD;  [facility];  Service: Endoscopy;  Laterality: N/A;  730 -moved to 8/19 @ 12:00  . PARATHYROIDECTOMY     2006 or 2007 for high levels.   Marland Kitchen VASECTOMY    . VASECTOMY  2016    There were no vitals  filed for this visit.  Subjective Assessment - 05/31/19 0951    Subjective  Patient says his knee feels about the same. Says he went to body pump last week and can tell that squat technique education from initial evaluation was helpful and that he could do more squats. Says he still gets to a point where knee began to hurt.    Pertinent History  bilateral avascular necrosis of hips    Patient Stated Goals  to have less pain    Currently in Pain?  No/denies    Pain Onset  More than a month ago                        [facility] Adult PT Treatment/Exercise - 05/31/19 0001      Exercises   Exercises  Knee/Hip      Knee/Hip Exercises: Stretches   Gastroc Stretch  Both;3 reps;30 seconds    Gastroc Stretch Limitations  at edge of step      Knee/Hip Exercises: Standing   Lateral Step Up  Left;1 set;10 reps;Hand Hold: 0;Step Height: 2"    Lateral Step Up Limitations  lateral heel tap down     Functional Squat  2 sets;10 reps    Functional Squat Limitations  to chair wiht foam pad for depth cue, GTB around knees      Knee/Hip Exercises: Supine   Bridges  Both;15 reps    Straight Leg Raises  Left;15 reps      Knee/Hip Exercises: Sidelying   Hip ABduction  Left;15 reps    Hip ADduction  Left;15 reps      Knee/Hip Exercises: Prone   Hip Extension  Left;15 reps             PT Education - 05/31/19 1809    Education Details  on exercise technique, functional movement assessment findings and on updated HEP handout    Person(s) Educated  Patient    Methods  Explanation;Handout    Comprehension  Verbalized understanding       PT Short Term Goals - 05/24/19 1027      PT SHORT TERM GOAL #1   Title  Patient will be independent in self management strategies to improve quality of life and functional outcomes.    Time  3    Period  Weeks    Status  New    Target Date  06/14/19      PT SHORT TERM GOAL #2   Title  Patient will be able to demonstrate a squat with good  form, no knee pain and at least to depth of 60 degrees to demonstrate improved functional strength    Time  3    Period  Weeks    Status  New    Target Date  06/14/19        PT Long Term Goals - 05/24/19 1029      PT LONG TERM GOAL #1   Title  Patient will be able to perform step down test without pain and improved knee control (decreased medial/lateral movement of the knee)    Time  6    Period  Weeks    Status  New    Target Date  07/05/19      PT LONG TERM GOAL #2   Title  Patient will report at least 50% improvement in overall symptoms and/or functional ability.    Time  6    Period  Weeks    Status  New    Target Date  07/05/19            Plan - 05/31/19 1757    Clinical Impression Statement  Performed goals review and issued HEP today. Initiated ther ex program. Patient educated on proper form and function of added exercise for improved hip and LLE strengthening to increase LT knee stabilization. Patient tolerated session well today, but did note mild discomfort about medial LT knee during functional squatting to edge of chair. Patient cued on avoiding knee valgus, and able to correct somewhat with cues, but remains limited by abnormal mechanics at ankle. Patient demos excessive ankle eversion with squatting, causing increased knee valgus moment. Added calf stretching at step to improve ankle mobility, which improved mechanics. Patient noted decreased discomfort afterward. Patient showed some improvement in form with lateral heel tap downs from 2 inch box after working to improve ankle mobility, but noted onset of slight discomfort at medial aspect of LT knee upon fatigue. Will continue to progress LLE strengthening and stabilization at knee with emphasis on mechanics. Patient educated on and issued updated HEP handout. Patient will continue to benefit from skilled therapy services  to progress LLE strength and body mechanics with functional movement patterns to reduce pain an  improve quality of life.    Personal Factors and Comorbidities  Comorbidity 1    Comorbidities  bilateral avascular necrosis of hips    Examination-Activity Limitations  Stairs;Squat;Bend    Examination-Participation Restrictions  Community Activity    Stability/Clinical Decision Making  Stable/Uncomplicated    Rehab Potential  Good    PT Frequency  Other (comment)   1-2x/week for total of 8 visits over 6 week certification period   PT Duration  6 weeks    PT Treatment/Interventions  ADLs/Self Care Home Management;Aquatic Therapy;Cryotherapy;Electrical Stimulation;Traction;Balance training;Moist Heat;Therapeutic exercise;Therapeutic activities;Iontophoresis 4mg /ml Dexamethasone;Stair training;Gait training;Neuromuscular re-education;Patient/family education;Manual techniques;Dry needling;Passive range of motion;Joint Manipulations    PT Next Visit Plan  Continue to progress LLE strengthening with emphasis on mechnics with functional movement patterns. Reassess LT ankle DF mobility. Continue to improve squat form and SL step downs    PT Home Exercise Plan  5/11 focus on squat form    Consulted and Agree with Plan of Care  Patient       Patient will benefit from skilled therapeutic intervention in order to improve the following deficits and impairments:  Pain, Decreased strength, Decreased mobility, Decreased activity tolerance  Visit Diagnosis: Chronic pain of left knee  Muscle weakness (generalized)     Problem List Patient Active Problem List   Diagnosis Date Noted  . Essential hypertension 08/06/2014  . High cholesterol 08/06/2014  . Aplastic anemia (HCC) 08/06/2014  . Pain of left heel 10/11/2012    6:11 PM, 05/31/19 06/02/19 PT DPT  Physical Therapist with [facility]  [facility]  [phone]   [facility] [address] [place], [place], [place] Phone: [phone]   Fax:  [phone]  Name: TRAVIUS CROCHET MRN: Lady Gary Date of Birth: 04-26-1970

## 2019-06-07 ENCOUNTER — Ambulatory Visit (HOSPITAL_COMMUNITY): Payer: 59

## 2019-06-07 ENCOUNTER — Encounter (HOSPITAL_COMMUNITY): Payer: Self-pay

## 2019-06-07 ENCOUNTER — Other Ambulatory Visit: Payer: Self-pay

## 2019-06-07 DIAGNOSIS — M25562 Pain in left knee: Secondary | ICD-10-CM | POA: Diagnosis not present

## 2019-06-07 DIAGNOSIS — G8929 Other chronic pain: Secondary | ICD-10-CM | POA: Diagnosis not present

## 2019-06-07 DIAGNOSIS — M6281 Muscle weakness (generalized): Secondary | ICD-10-CM

## 2019-06-07 NOTE — Therapy (Signed)
[place], [place], [address] Phone: [phone]   Fax:  [phone]  Physical Therapy Treatment  Patient Details  Name: Kyle Gilbert MRN: [id number] Date of Birth: [birth year] Referring Provider (PT): Lona Kettle   Encounter Date: 06/07/2019  PT End of Session - 06/07/19 1011    Visit Number  3    Number of Visits  8    Date for PT Re-Evaluation  07/05/19    Authorization Type  [place] [employer] - No VL, auth required at 25th visit    Authorization - Visit Number  3    Authorization - Number of Visits  25    Progress Note Due on Visit  10    PT Start Time  1006    PT Stop Time  1045    PT Time Calculation (min)  39 min    Activity Tolerance  Patient tolerated treatment well    Behavior During Therapy  [facility] for tasks assessed/performed       Past Medical History:  Diagnosis Date  . Aplastic anemia ([place])   . Aplastic anemia ([place]) 1998   at age 47, started having fatigue and bruising, was found on blood work, had BMT  . Blood transfusion without reported diagnosis    Due to Aplastic Anemia  . GERD (gastroesophageal reflux disease)   . High cholesterol   . Hyperlipidemia    could not tolerate statin  . Hypertension   . Hypertension    Losartan 100 mg, well controlled  . Parathyroid abnormality ([place])    ?hyper vs hypo; removed around 2005    Past Surgical History:  Procedure Laterality Date  . avascular necrosis     both hips   . BONE MARROW TRANSPLANT     1998 in [place] at [place]   Aplastic Anemia  . cataracts     bilateral.  . ESOPHAGOGASTRODUODENOSCOPY N/A 08/31/2014   Procedure: ESOPHAGOGASTRODUODENOSCOPY (EGD);  Surgeon: Rogene Houston, MD;  [facility];  Service: Endoscopy;  Laterality: N/A;  730 -moved to 8/19 @ 12:00  . PARATHYROIDECTOMY     2006 or 2007 for high levels.   Marland Kitchen VASECTOMY    . VASECTOMY  2016    There were no vitals  filed for this visit.  Subjective Assessment - 06/07/19 1010    Subjective  Pt stated his knee is feeling good feels the change in mechanics with squats have helped a lot as well as stretches.  No reports of pain currently.    Patient Stated Goals  to have less pain    Currently in Pain?  No/denies         [facility] PT Assessment - 06/07/19 0001      Assessment   Medical Diagnosis  left knee pain    Referring Provider (PT)  Lona Kettle    Prior Therapy  yes      Precautions   Precautions  None      AROM   Right Ankle Dorsiflexion  8    Left Ankle Dorsiflexion  4                    [facility] Adult PT Treatment/Exercise - 06/07/19 0001      Exercises   Exercises  Knee/Hip      Knee/Hip Exercises: Stretches   Gastroc Stretch  Both;3 reps;30 seconds;1 rep    Gastroc Stretch Limitations  slant board; 1x 30" against wall      Knee/Hip Exercises: Standing   Heel Raises  15 reps    Heel Raises Limitations  15x toe raises slope    Forward Lunges  Both;2 sets;5 reps    Forward Lunges Limitations  Educated benefits with step height during lunge with floor, 4in and 7in    Side Lunges  5 reps    Side Lunges Limitations  review form with     Lateral Step Up  2 sets;10 reps;Hand Hold: 2;Step Height: 4"    Lateral Step Up Limitations  lateral heel tap down     Forward Step Up  Left;15 reps;Hand Hold: 0;Step Height: 6"    Functional Squat  10 reps;3 sets    Functional Squat Limitations  to chair wiht foam pad for depth cue, GTB around knees    SLS  Rt 60", Lt     Other Standing Knee Exercises  kneeling Lt 1in from wall, Rt .5in from wall to assess DF    Other Standing Knee Exercises  side step RTB 3RT; posterior lunge 10x BLE               PT Short Term Goals - 05/24/19 1027      PT SHORT TERM GOAL #1   Title  Patient will be independent in self management strategies to improve quality of life and functional outcomes.    Time  3    Period  Weeks    Status  New     Target Date  06/14/19      PT SHORT TERM GOAL #2   Title  Patient will be able to demonstrate a squat with good form, no knee pain and at least to depth of 60 degrees to demonstrate improved functional strength    Time  3    Period  Weeks    Status  New    Target Date  06/14/19        PT Long Term Goals - 05/24/19 1029      PT LONG TERM GOAL #1   Title  Patient will be able to perform step down test without pain and improved knee control (decreased medial/lateral movement of the knee)    Time  6    Period  Weeks    Status  New    Target Date  07/05/19      PT LONG TERM GOAL #2   Title  Patient will report at least 50% improvement in overall symptoms and/or functional ability.    Time  6    Period  Weeks    Status  New    Target Date  07/05/19            Plan - 06/07/19 1302    Clinical Impression Statement  Session focus on improving awareness of mechanics with functional strengthening activities.  Pt able to demonstrate good mechanics with squats this session following cueing for appropriate weight bearing.  Progressed quad and gluteal strengthening this session with visible musculature fatigue noted.  Added lunges and reviewed mechanics, encouraged pt to complete lunges on increased height step during body pump class to reduce irritation anterior knee, pt able to demonstrate and verbalized understanding.  Also encouraged to slow down and improve mechanics as lunges as done quicly in class.  Did assess BLE DF this session, improved since eval but not equal ROM.  Added slant board stretch and reviewed gastroc stretches at home.  No reports of pain  through session.    Personal Factors and Comorbidities  Comorbidity 1    Comorbidities  bilateral avascular necrosis of hips    Examination-Activity Limitations  Stairs;Squat;Bend    Examination-Participation Restrictions  Community Activity    Stability/Clinical Decision Making  Stable/Uncomplicated    Clinical Decision Making   Low    Rehab Potential  Good    PT Frequency  --   1-2x/week for total of 8 visits over 6 week certification period   PT Duration  6 weeks    PT Treatment/Interventions  ADLs/Self Care Home Management;Aquatic Therapy;Cryotherapy;Electrical Stimulation;Traction;Balance training;Moist Heat;Therapeutic exercise;Therapeutic activities;Iontophoresis 4mg /ml Dexamethasone;Stair training;Gait training;Neuromuscular re-education;Patient/family education;Manual techniques;Dry needling;Passive range of motion;Joint Manipulations    PT Next Visit Plan  Continue to progress LLE strengthening with emphasis on mechnics with functional movement patterns. Reassess LT ankle DF mobility. Continue to improve squat form and SL step downs    PT Home Exercise Plan  5/11 focus on squat form       Patient will benefit from skilled therapeutic intervention in order to improve the following deficits and impairments:  Pain, Decreased strength, Decreased mobility, Decreased activity tolerance  Visit Diagnosis: Chronic pain of left knee  Muscle weakness (generalized)     Problem List Patient Active Problem List   Diagnosis Date Noted  . Essential hypertension 08/06/2014  . High cholesterol 08/06/2014  . Aplastic anemia (HCC) 08/06/2014  . Pain of left heel 10/11/2012   10/13/2012, LPTA/CLT; CBIS [phone]  [phone] 06/07/2019, 1:09 PM  [place] [facility] [address] [place], [place], [place] Phone: [phone]   Fax:  [phone]  Name: Kyle Gilbert MRN: Lady Gary Date of Birth: 1970-10-07

## 2019-06-14 ENCOUNTER — Encounter (HOSPITAL_COMMUNITY): Payer: Self-pay | Admitting: Physical Therapy

## 2019-06-14 ENCOUNTER — Other Ambulatory Visit: Payer: Self-pay

## 2019-06-14 ENCOUNTER — Ambulatory Visit (HOSPITAL_COMMUNITY): Payer: 59 | Attending: Family Medicine | Admitting: Physical Therapy

## 2019-06-14 DIAGNOSIS — M6281 Muscle weakness (generalized): Secondary | ICD-10-CM | POA: Insufficient documentation

## 2019-06-14 DIAGNOSIS — G8929 Other chronic pain: Secondary | ICD-10-CM | POA: Insufficient documentation

## 2019-06-14 DIAGNOSIS — M25562 Pain in left knee: Secondary | ICD-10-CM | POA: Diagnosis not present

## 2019-06-14 NOTE — Therapy (Signed)
[facility] [address] [place], [place], [address] Phone: [phone]   Fax:  [phone]  Physical Therapy Treatment  Patient Details  Name: Kyle Gilbert MRN: [id number] Date of Birth: 1970-10-27 Referring Provider (PT): Gildardo Cranker   Encounter Date: 06/14/2019  PT End of Session - 06/14/19 1010    Visit Number  4    Number of Visits  8    Date for PT Re-Evaluation  07/05/19    Authorization Type  [place] [employer] - No VL, auth required at 25th visit    Authorization - Visit Number  4    Authorization - Number of Visits  25    Progress Note Due on Visit  10    PT Start Time  0915    PT Stop Time  1000    PT Time Calculation (min)  45 min    Activity Tolerance  Patient tolerated treatment well    Behavior During Therapy  [facility] for tasks assessed/performed       Past Medical History:  Diagnosis Date  . Aplastic anemia (HCC)   . Aplastic anemia (HCC) 1998   at age 34, started having fatigue and bruising, was found on blood work, had BMT  . Blood transfusion without reported diagnosis    Due to Aplastic Anemia  . GERD (gastroesophageal reflux disease)   . High cholesterol   . Hyperlipidemia    could not tolerate statin  . Hypertension   . Hypertension    Losartan 100 mg, well controlled  . Parathyroid abnormality (HCC)    ?hyper vs hypo; removed around 2005    Past Surgical History:  Procedure Laterality Date  . avascular necrosis     both hips   . BONE MARROW TRANSPLANT     1998 in [place] at [facility]  . BONE MARROW TRANSPLANT  1998   Aplastic Anemia  . cataracts     bilateral.  . ESOPHAGOGASTRODUODENOSCOPY N/A 08/31/2014   Procedure: ESOPHAGOGASTRODUODENOSCOPY (EGD);  Surgeon: Malissa Hippo, MD;  [facility];  Service: Endoscopy;  Laterality: N/A;  730 -moved to 8/19 @ 12:00  . PARATHYROIDECTOMY     2006 or 2007 for high levels.   Marland Kitchen VASECTOMY    . VASECTOMY  2016    There were no vitals  filed for this visit.  Subjective Assessment - 06/14/19 1002    Subjective  States he feels like the stretches and new squatting techniques seems to be helping.    Patient Stated Goals  to have less pain         [facility] PT Assessment - 06/14/19 0001      Assessment   Medical Diagnosis  left knee pain    Referring Provider (PT)  Gildardo Cranker                    [facility] Adult PT Treatment/Exercise - 06/14/19 0001      Knee/Hip Exercises: Standing   Other Standing Knee Exercises  tibial translation stretch - kneeling 2x5 5" holds B - left knee stretch and R ankle stretch; decline squat with verbal cues - pain at left knee during - video of squat on lflat ground with verbal cues    Other Standing Knee Exercises  hip hinge with dowel - prior demonstration - practice with verbal and tactile cues - 10 minutes      Knee/Hip Exercises: Seated   Other Seated Knee/Hip Exercises  hip hinge with  dowel - prior demonstration - practice with verbal and tactile cues - 8 minutes      Knee/Hip Exercises: Prone   Other Prone Exercises  reverse nordic curls - 6 minutes - tactile and verbal cues             PT Education - 06/14/19 1005    Education Details  Educated patient on anatomy- knee joint being hinge joint, hip being ball and socket joint and how we typical move too much at the knee and back and not enough with hip and ankle. Educated on different movement patterns, active vs passive postures and movements. educated patient on his medical history and how strengthening hip musculature and working on strengthening and lengthening (eccentric and isometric) exercises will help provide stability and offload hips and knees.    Person(s) Educated  Patient    Methods  Explanation    Comprehension  Verbalized understanding       PT Short Term Goals - 05/24/19 1027      PT SHORT TERM GOAL #1   Title  Patient will be independent in self management strategies to improve quality of life  and functional outcomes.    Time  3    Period  Weeks    Status  New    Target Date  06/14/19      PT SHORT TERM GOAL #2   Title  Patient will be able to demonstrate a squat with good form, no knee pain and at least to depth of 60 degrees to demonstrate improved functional strength    Time  3    Period  Weeks    Status  New    Target Date  06/14/19        PT Long Term Goals - 05/24/19 1029      PT LONG TERM GOAL #1   Title  Patient will be able to perform step down test without pain and improved knee control (decreased medial/lateral movement of the knee)    Time  6    Period  Weeks    Status  New    Target Date  07/05/19      PT LONG TERM GOAL #2   Title  Patient will report at least 50% improvement in overall symptoms and/or functional ability.    Time  6    Period  Weeks    Status  New    Target Date  07/05/19            Plan - 06/14/19 1005    Clinical Impression Statement  Patient educated in anatomy, joint mechanics and movement patterns. Discussed and educated patient in active vs passive positions as well as isometric and eccentric strength. Added hip hinge movement and Nordic curls to HEP. Improved ability to perform in clinic and no pain noted during session. will continue to progress hip, ankle and thoracic movement patterns and muscle activation to help decrease stress at knee and hips.    Personal Factors and Comorbidities  Comorbidity 1    Comorbidities  bilateral avascular necrosis of hips    Examination-Activity Limitations  Stairs;Squat;Bend    Examination-Participation Restrictions  Community Activity    Stability/Clinical Decision Making  Stable/Uncomplicated    Rehab Potential  Good    PT Frequency  --   1-2x/week for total of 8 visits over 6 week certification period   PT Duration  6 weeks    PT Treatment/Interventions  ADLs/Self Care Home Management;Aquatic Therapy;Cryotherapy;Electrical Stimulation;Traction;Balance training;Moist  Heat;Therapeutic exercise;Therapeutic  activities;Iontophoresis 4mg /ml Dexamethasone;Stair training;Gait training;Neuromuscular re-education;Patient/family education;Manual techniques;Dry needling;Passive range of motion;Joint Manipulations    PT Next Visit Plan  f/u with hip hinge and nordic curl, add flobility breathing/core exercise - (upper and lower), hip hinge progression and lunge hold as able    PT Home Exercise Plan  5/11 focus on squat form; 6/2 reverse nordic curl, hip hinge seated and standing with dowel       Patient will benefit from skilled therapeutic intervention in order to improve the following deficits and impairments:  Pain, Decreased strength, Decreased mobility, Decreased activity tolerance  Visit Diagnosis: Chronic pain of left knee  Muscle weakness (generalized)     Problem List Patient Active Problem List   Diagnosis Date Noted  . Essential hypertension 08/06/2014  . High cholesterol 08/06/2014  . Aplastic anemia ([place]) 08/06/2014  . Pain of left heel 10/11/2012    10:11 AM, 06/14/19 Jerene Pitch, DPT Physical Therapy with [facility]  [phone] office  [place] [address] [place], [place], [address] Phone: [phone]   Fax:  [phone]  Name: RAYDON CHAPPUIS MRN: [id number] Date of Birth: September 06, 1970

## 2019-06-21 ENCOUNTER — Other Ambulatory Visit: Payer: Self-pay

## 2019-06-21 ENCOUNTER — Encounter (HOSPITAL_COMMUNITY): Payer: Self-pay | Admitting: Physical Therapy

## 2019-06-21 ENCOUNTER — Ambulatory Visit (HOSPITAL_COMMUNITY): Payer: 59 | Admitting: Physical Therapy

## 2019-06-21 DIAGNOSIS — G8929 Other chronic pain: Secondary | ICD-10-CM

## 2019-06-21 DIAGNOSIS — M6281 Muscle weakness (generalized): Secondary | ICD-10-CM | POA: Diagnosis not present

## 2019-06-21 DIAGNOSIS — M25562 Pain in left knee: Secondary | ICD-10-CM | POA: Diagnosis not present

## 2019-06-21 NOTE — Therapy (Signed)
[place], [place], [address] Phone: [phone]   Fax:  [phone]  Physical Therapy Treatment  Patient Details  Name: Kyle Gilbert MRN: [id number] Date of Birth: 01-Feb-1970 Referring Provider (PT): Lona Kettle   Encounter Date: 06/21/2019  PT End of Session - 06/21/19 0913    Visit Number  5    Number of Visits  8    Date for PT Re-Evaluation  07/05/19    Authorization Type  [employer] [employer] - No VL, auth required at 25th visit    Authorization - Visit Number  5    Authorization - Number of Visits  25    Progress Note Due on Visit  10    PT Start Time  0915    PT Stop Time  0955    PT Time Calculation (min)  40 min    Activity Tolerance  Patient tolerated treatment well    Behavior During Therapy  [facility] for tasks assessed/performed       Past Medical History:  Diagnosis Date  . Aplastic anemia ([place])   . Aplastic anemia ([place]) 1998   at age 76, started having fatigue and bruising, was found on blood work, had BMT  . Blood transfusion without reported diagnosis    Due to Aplastic Anemia  . GERD (gastroesophageal reflux disease)   . High cholesterol   . Hyperlipidemia    could not tolerate statin  . Hypertension   . Hypertension    Losartan 100 mg, well controlled  . Parathyroid abnormality ([place])    ?hyper vs hypo; removed around 2005    Past Surgical History:  Procedure Laterality Date  . avascular necrosis     both hips   . BONE MARROW TRANSPLANT     1998 in [place] at [place]   Aplastic Anemia  . cataracts     bilateral.  . ESOPHAGOGASTRODUODENOSCOPY N/A 08/31/2014   Procedure: ESOPHAGOGASTRODUODENOSCOPY (EGD);  Surgeon: Rogene Houston, MD;  [facility];  Service: Endoscopy;  Laterality: N/A;  730 -moved to 8/19 @ 12:00  . PARATHYROIDECTOMY     2006 or 2007 for high levels.   Marland Kitchen VASECTOMY    . VASECTOMY  2016    There were no vitals  filed for this visit.  Subjective Assessment - 06/21/19 0927    Subjective  Focused on strengthening and mobility today. Tolerated well with no reports of pain. Fatigue noted end of session. Will continue to focus on progressing home program and strengthening right shoulder.    Patient Stated Goals  to have less pain         [facility] PT Assessment - 06/21/19 0001      Assessment   Medical Diagnosis  left knee pain    Referring Provider (PT)  Lona Kettle                    [facility] Adult PT Treatment/Exercise - 06/21/19 0001      Knee/Hip Exercises: Standing   Other Standing Knee Exercises  lunge holds - perturbations from all directions - with 2 foam pads under knee, hip openers 4x5 - rest break for left hip down - more challenging.     Other Standing Knee Exercises  hip hinge with dowel for cues - x15; lunge up holds 4x5 B 2 pieces of foam under knee for support  PT Short Term Goals - 05/24/19 1027      PT SHORT TERM GOAL #1   Title  Patient will be independent in self management strategies to improve quality of life and functional outcomes.    Time  3    Period  Weeks    Status  New    Target Date  06/14/19      PT SHORT TERM GOAL #2   Title  Patient will be able to demonstrate a squat with good form, no knee pain and at least to depth of 60 degrees to demonstrate improved functional strength    Time  3    Period  Weeks    Status  New    Target Date  06/14/19        PT Long Term Goals - 05/24/19 1029      PT LONG TERM GOAL #1   Title  Patient will be able to perform step down test without pain and improved knee control (decreased medial/lateral movement of the knee)    Time  6    Period  Weeks    Status  New    Target Date  07/05/19      PT LONG TERM GOAL #2   Title  Patient will report at least 50% improvement in overall symptoms and/or functional ability.    Time  6    Period  Weeks    Status  New    Target Date  07/05/19             Plan - 06/21/19 0914    Clinical Impression Statement  Focused on lunge form and strengthening in this position. Unable to perform lunge up secondary to knee pain at about 70 degrees of knee pain (this Is the degree where his knee hurts with squats too).  Fatigue noted during and end of session. No residual pain noted end of session.    Personal Factors and Comorbidities  Comorbidity 1    Comorbidities  bilateral avascular necrosis of hips    Examination-Activity Limitations  Stairs;Squat;Bend    Examination-Participation Restrictions  Community Activity    Stability/Clinical Decision Making  Stable/Uncomplicated    Rehab Potential  Good    PT Frequency  --   1-2x/week for total of 8 visits over 6 week certification period   PT Duration  6 weeks    PT Treatment/Interventions  ADLs/Self Care Home Management;Aquatic Therapy;Cryotherapy;Electrical Stimulation;Traction;Balance training;Moist Heat;Therapeutic exercise;Therapeutic activities;Iontophoresis 4mg /ml Dexamethasone;Stair training;Gait training;Neuromuscular re-education;Patient/family education;Manual techniques;Dry needling;Passive range of motion;Joint Manipulations    PT Next Visit Plan  f/u with hip hinge and nordic curl, add flobility breathing/core exercise - (upper and lower), hip hinge progression and lunge hold as able    PT Home Exercise Plan  5/11 focus on squat form; 6/2 reverse nordic curl, hip hinge seated and standing with dowel; 6/9 lunge ups and openers       Patient will benefit from skilled therapeutic intervention in order to improve the following deficits and impairments:  Pain, Decreased strength, Decreased mobility, Decreased activity tolerance  Visit Diagnosis: Chronic pain of left knee  Muscle weakness (generalized)     Problem List Patient Active Problem List   Diagnosis Date Noted  . Essential hypertension 08/06/2014  . High cholesterol 08/06/2014  . Aplastic anemia ([facility]) 08/06/2014  .  Pain of left heel 10/11/2012    10:02 AM, 06/21/19 08/21/19, DPT Physical Therapy with [facility]  [phone] office  [facility] [address] [place], [place], [address] Phone: [phone]   Fax:  [phone]  Name: CRESTON KLAS MRN: [id number] Date of Birth: [birth year]

## 2019-07-05 ENCOUNTER — Ambulatory Visit (HOSPITAL_COMMUNITY): Payer: 59 | Admitting: Physical Therapy

## 2019-07-05 ENCOUNTER — Encounter (HOSPITAL_COMMUNITY): Payer: Self-pay | Admitting: Physical Therapy

## 2019-07-05 ENCOUNTER — Other Ambulatory Visit: Payer: Self-pay

## 2019-07-05 DIAGNOSIS — M6281 Muscle weakness (generalized): Secondary | ICD-10-CM | POA: Diagnosis not present

## 2019-07-05 DIAGNOSIS — G8929 Other chronic pain: Secondary | ICD-10-CM | POA: Diagnosis not present

## 2019-07-05 DIAGNOSIS — M25562 Pain in left knee: Secondary | ICD-10-CM | POA: Diagnosis not present

## 2019-07-05 MED FILL — LOSARTAN-HCTZ 100-12.5 MG T: 100-12.5 | 90 days supply | Qty: 90 | Fill #1

## 2019-07-05 MED FILL — ROSUVASTATIN CALCIUM 20 MG: 20 | 90 days supply | Qty: 90 | Fill #1

## 2019-07-05 NOTE — Therapy (Signed)
[place] [address] [place], [place], [address] Phone: [phone]   Fax:  [phone]  Physical Therapy Treatment and Discharge Note  Patient Details  Name: Kyle Gilbert MRN: [id number] Date of Birth: 1970/11/12 Referring Provider (PT): Lona Kettle  PHYSICAL THERAPY DISCHARGE SUMMARY  Visits from Start of Care: 6  Current functional level related to goals / functional outcomes: 2/2 short term goals and 1/2 long term goals met   Remaining deficits: Continued pain at deeper depth of squats   Education / Equipment: See below Plan: Patient agrees to discharge.  Patient goals were partially met. Patient is being discharged due to being pleased with the current functional level.  ?????        Encounter Date: 07/05/2019   PT End of Session - 07/05/19 0832    Visit Number 6    Number of Visits 8    Date for PT Re-Evaluation 07/05/19    Authorization Type [place] [employer] - No VL, auth required at 25th visit    Authorization - Visit Number 6    Authorization - Number of Visits 25    Progress Note Due on Visit 10    PT Start Time 0830    PT Stop Time 0915    PT Time Calculation (min) 45 min    Activity Tolerance Patient tolerated treatment well    Behavior During Therapy [facility] for tasks assessed/performed           Past Medical History:  Diagnosis Date  . Aplastic anemia ([place])   . Aplastic anemia ([place]) 1998   at age 37, started having fatigue and bruising, was found on blood work, had BMT  . Blood transfusion without reported diagnosis    Due to Aplastic Anemia  . GERD (gastroesophageal reflux disease)   . High cholesterol   . Hyperlipidemia    could not tolerate statin  . Hypertension   . Hypertension    Losartan 100 mg, well controlled  . Parathyroid abnormality ([place])    ?hyper vs hypo; removed around 2005    Past Surgical History:  Procedure Laterality Date  . avascular necrosis     both hips   . BONE  MARROW TRANSPLANT     1998 in [place] at [place]   Aplastic Anemia  . cataracts     bilateral.  . ESOPHAGOGASTRODUODENOSCOPY N/A 08/31/2014   Procedure: ESOPHAGOGASTRODUODENOSCOPY (EGD);  Surgeon: Rogene Houston, MD;  [facility];  Service: Endoscopy;  Laterality: N/A;  730 -moved to 8/19 @ 12:00  . PARATHYROIDECTOMY     2006 or 2007 for high levels.   Marland Kitchen VASECTOMY    . VASECTOMY  2016    There were no vitals filed for this visit.   Subjective Assessment - 07/05/19 0844    Subjective States the pain is the same at the same spot. States he has been doing her exercises. States that yesterday he was trying to sit down on a low bench and his knee started to hurt and he almost fell off the bench.    Patient Stated Goals to have less pain    Currently in Pain? No/denies              [facility] PT Assessment - 07/05/19 0001      Assessment   Medical Diagnosis left knee pain    Referring Provider (PT) Lona Kettle  [place] Adult PT Treatment/Exercise - 07/05/19 0001      Knee/Hip Exercises: Standing   Other Standing Knee Exercises glute isometric in ready position - cue to pull femur back - tactile and verbal cues - 12 minutes; then added with squat - used tactile cues to block knees for deeper squat - 12 minutes                  PT Education - 07/05/19 1027    Education Details on current condition, on progress made, of continued posture/movement limitations and dysfunction. On flowbility program and focus on hip movement and strength.    Person(s) Educated Patient    Methods Explanation    Comprehension Verbalized understanding            PT Short Term Goals - 07/05/19 0912      PT SHORT TERM GOAL #1   Title Patient will be independent in self management strategies to improve quality of life and functional outcomes.    Time 3    Period Weeks    Status Achieved    Target Date  06/14/19      PT SHORT TERM GOAL #2   Title Patient will be able to demonstrate a squat with good form, no knee pain and at least to depth of 60 degrees to demonstrate improved functional strength    Baseline 70 degrees    Time 3    Period Weeks    Status Achieved    Target Date 06/14/19             PT Long Term Goals - 07/05/19 1013      PT LONG TERM GOAL #1   Title Patient will be able to perform step down test without pain and improved knee control (decreased medial/lateral movement of the knee)    Baseline can perform but still intermittent pain on stairs with faitgue    Time 6    Period Weeks    Status Partially Met      PT LONG TERM GOAL #2   Title Patient will report at least 50% improvement in overall symptoms and/or functional ability.    Baseline AT least 50% better with focusing on movements    Time 6    Period Weeks    Status Achieved                 Plan - 07/05/19 1021    Clinical Impression Statement Patient has made great progress since start of physical therapy. Patient has met 2/2 short term goals and  long term goals at this time. Continues to have knee pain at deeper range of squats but improved pain free depth achieved. Educated patient on continued excessive movements about the knee vs the pelvis with squats. Able to block knees with table to help promote greater hip motion with squats. Answered all questions prior to end of session. Patient to discharge from PT to HEP secondary to progress made and independence in home program.    Personal Factors and Comorbidities Comorbidity 1    Comorbidities bilateral avascular necrosis of hips    Examination-Activity Limitations Stairs;Squat;Bend    Examination-Participation Restrictions Community Activity    Stability/Clinical Decision Making Stable/Uncomplicated    Rehab Potential Good    PT Frequency --   1-2x/week for total of 8 visits over 6 week certification period   PT Duration 6 weeks    PT  Treatment/Interventions ADLs/Self Care Home Management;Aquatic Therapy;Cryotherapy;Electrical Stimulation;Traction;Balance training;Moist Heat;Therapeutic exercise;Therapeutic activities;Iontophoresis '4mg'$ /ml  Dexamethasone;Stair training;Gait training;Neuromuscular re-education;Patient/family education;Manual techniques;Dry needling;Passive range of motion;Joint Manipulations    PT Next Visit Plan pt to discharge from PT to [place] 5/11 focus on squat form; 6/2 reverse nordic curl, hip hinge seated and standing with dowel; 6/9 lunge ups and openers; 6/23 femur pull back    Consulted and Agree with Plan of Care Patient           Patient will benefit from skilled therapeutic intervention in order to improve the following deficits and impairments:  Pain, Decreased strength, Decreased mobility, Decreased activity tolerance  Visit Diagnosis: Chronic pain of left knee  Muscle weakness (generalized)     Problem List Patient Active Problem List   Diagnosis Date Noted  . Essential hypertension 08/06/2014  . High cholesterol 08/06/2014  . Aplastic anemia ([place]) 08/06/2014  . Pain of left heel 10/11/2012   10:29 AM, 07/05/19 Jerene Pitch, DPT Physical Therapy with [facility]  [phone] office  [place] [address] [place], [place], [address] Phone: [phone]   Fax:  [phone]  Name: Kyle Gilbert MRN: [id number] Date of Birth: 11-23-1970

## 2019-10-06 ENCOUNTER — Other Ambulatory Visit (HOSPITAL_COMMUNITY): Payer: Self-pay | Admitting: Family Medicine

## 2019-10-06 MED FILL — SILDENAFIL CITRATE 20 MG TA: 20 | 30 days supply | Qty: 50 | Fill #1

## 2019-10-06 MED FILL — LOSARTAN-HCTZ 100-12.5 MG T: 100-12.5 | 90 days supply | Qty: 90 | Fill #2

## 2019-10-06 MED FILL — ROSUVASTATIN CALCIUM 20 MG: 20 | 90 days supply | Qty: 90 | Fill #0

## 2019-11-21 ENCOUNTER — Ambulatory Visit: Payer: 59

## 2019-11-21 ENCOUNTER — Ambulatory Visit: Payer: 59 | Attending: Internal Medicine

## 2019-11-21 DIAGNOSIS — Z23 Encounter for immunization: Secondary | ICD-10-CM

## 2019-11-21 NOTE — Progress Notes (Signed)
   Covid-19 Vaccination Clinic  Name:  Kyle Gilbert    MRN: [id number] DOB: September 12, 1970  11/21/2019  Kyle Gilbert was observed post Covid-19 immunization for 15 minutes without incident. He was provided with Vaccine Information Sheet and instruction to access the V-Safe system.   Kyle Gilbert was instructed to call 911 with any severe reactions post vaccine: Marland Kitchen Difficulty breathing  . Swelling of face and throat  . A fast heartbeat  . A bad rash all over body  . Dizziness and weakness

## 2020-01-08 MED FILL — SILDENAFIL CITRATE 20 MG TA: 20 | 30 days supply | Qty: 50 | Fill #2

## 2020-01-08 MED FILL — LOSARTAN-HCTZ 100-12.5 MG T: 100-12.5 | 30 days supply | Qty: 30 | Fill #3

## 2020-01-08 MED FILL — ROSUVASTATIN CALCIUM 20 MG: 20 | 90 days supply | Qty: 90 | Fill #1

## 2020-01-09 DIAGNOSIS — R002 Palpitations: Secondary | ICD-10-CM | POA: Diagnosis not present

## 2020-01-09 DIAGNOSIS — R253 Fasciculation: Secondary | ICD-10-CM | POA: Diagnosis not present

## 2020-02-07 ENCOUNTER — Other Ambulatory Visit: Payer: 59

## 2020-02-07 DIAGNOSIS — Z20822 Contact with and (suspected) exposure to covid-19: Secondary | ICD-10-CM

## 2020-02-09 LAB — SARS-COV-2, NAA 2 DAY TAT

## 2020-02-09 LAB — NOVEL CORONAVIRUS, NAA: SARS-CoV-2, NAA: NOT DETECTED

## 2020-03-06 MED FILL — LOSARTAN-HCTZ 100-12.5 MG T: 100-12.5 | 30 days supply | Qty: 30 | Fill #4

## 2020-03-08 DIAGNOSIS — H5213 Myopia, bilateral: Secondary | ICD-10-CM | POA: Diagnosis not present

## 2020-04-08 ENCOUNTER — Other Ambulatory Visit (HOSPITAL_COMMUNITY): Payer: Self-pay | Admitting: Family Medicine

## 2020-04-08 DIAGNOSIS — E7800 Pure hypercholesterolemia, unspecified: Secondary | ICD-10-CM | POA: Diagnosis not present

## 2020-04-08 DIAGNOSIS — I10 Essential (primary) hypertension: Secondary | ICD-10-CM | POA: Diagnosis not present

## 2020-04-08 DIAGNOSIS — Z8739 Personal history of other diseases of the musculoskeletal system and connective tissue: Secondary | ICD-10-CM | POA: Diagnosis not present

## 2020-04-08 DIAGNOSIS — R7303 Prediabetes: Secondary | ICD-10-CM | POA: Diagnosis not present

## 2020-04-08 DIAGNOSIS — Z0000 Encounter for general adult medical examination without abnormal findings: Secondary | ICD-10-CM | POA: Diagnosis not present

## 2020-04-08 DIAGNOSIS — K760 Fatty (change of) liver, not elsewhere classified: Secondary | ICD-10-CM | POA: Diagnosis not present

## 2020-04-08 DIAGNOSIS — Z862 Personal history of diseases of the blood and blood-forming organs and certain disorders involving the immune mechanism: Secondary | ICD-10-CM | POA: Diagnosis not present

## 2020-04-08 DIAGNOSIS — J309 Allergic rhinitis, unspecified: Secondary | ICD-10-CM | POA: Diagnosis not present

## 2020-04-08 DIAGNOSIS — Z13220 Encounter for screening for lipoid disorders: Secondary | ICD-10-CM | POA: Diagnosis not present

## 2020-04-08 MED FILL — LOSARTAN-HCTZ 100-12.5 MG T: 100-12.5 | 30 days supply | Qty: 30 | Fill #0

## 2020-04-08 MED FILL — ROSUVASTATIN CALCIUM 20 MG: 20 | 90 days supply | Qty: 90 | Fill #0

## 2020-04-08 MED FILL — SILDENAFIL CITRATE 20 MG TA: 20 | 30 days supply | Qty: 50 | Fill #0

## 2020-04-09 MED FILL — FLUTICASONE PROP 50 MCG SPR: 50 | 60 days supply | Qty: 16 | Fill #0

## 2020-04-23 ENCOUNTER — Other Ambulatory Visit (HOSPITAL_COMMUNITY): Payer: Self-pay

## 2020-04-26 ENCOUNTER — Other Ambulatory Visit (HOSPITAL_COMMUNITY): Payer: Self-pay

## 2020-05-08 ENCOUNTER — Other Ambulatory Visit (HOSPITAL_COMMUNITY): Payer: Self-pay

## 2020-05-08 MED FILL — Losartan Potassium & Hydrochlorothiazide Tab 100-12.5 MG: ORAL | 90 days supply | Qty: 90 | Fill #0 | Status: AC

## 2020-05-13 ENCOUNTER — Other Ambulatory Visit (HOSPITAL_COMMUNITY): Payer: Self-pay

## 2020-05-14 IMAGING — US ULTRASOUND ABDOMEN COMPLETE
1 series · 13 of 25 positions shown · non-contrast
Comparison: 09/17/2015

CLINICAL DATA: Aplastic anemia, bone marrow transplant in 0336,
question splenomegaly on physical exam

EXAM:
ABDOMEN ULTRASOUND COMPLETE

[Series 1: ultrasound abdomen complete · 0.20mm/px · 13 of 80 slices shown]
[im 1/80]
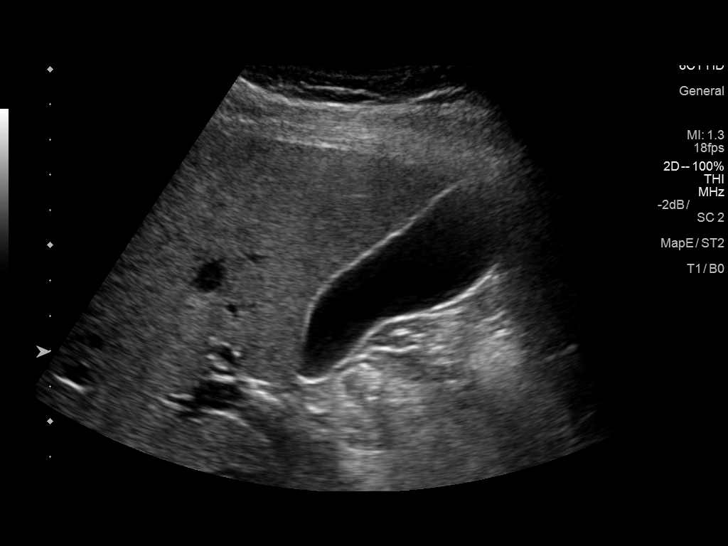
[im 7/80]
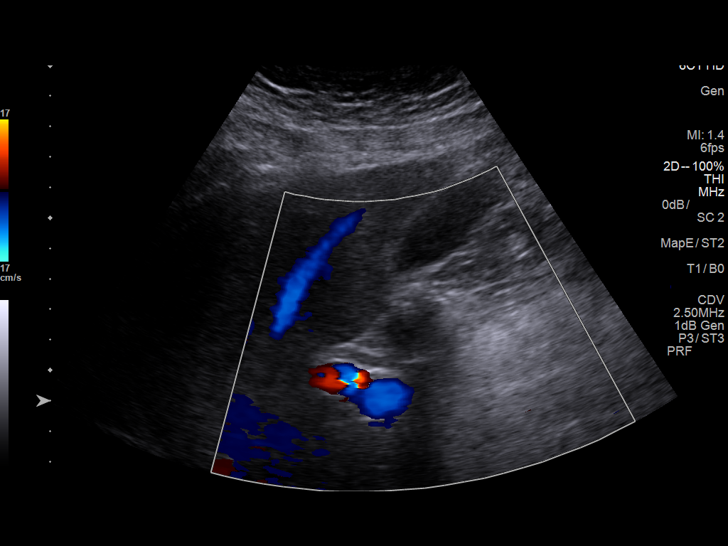
[im 14/80]
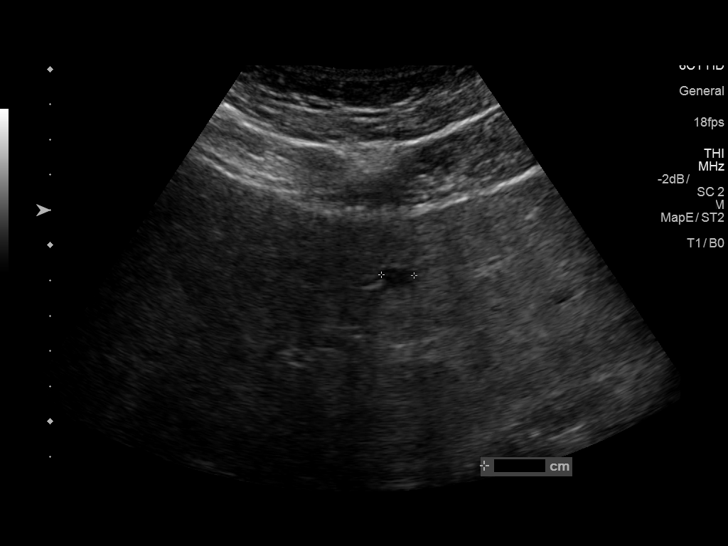
[im 20/80]
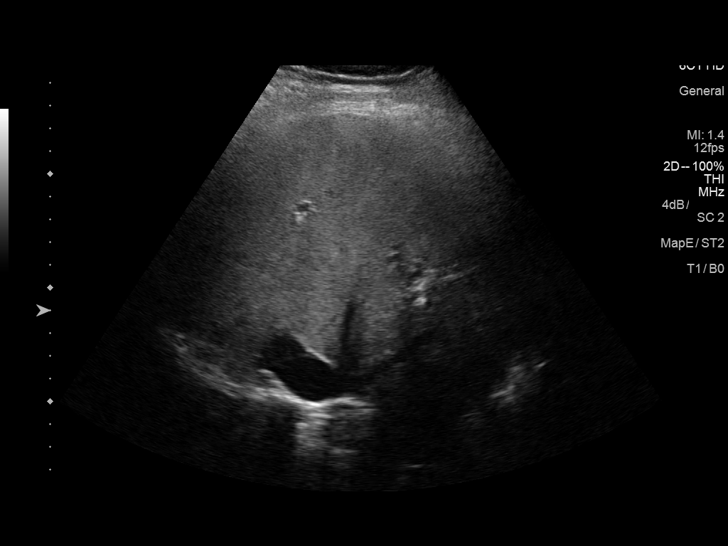
[im 27/80]
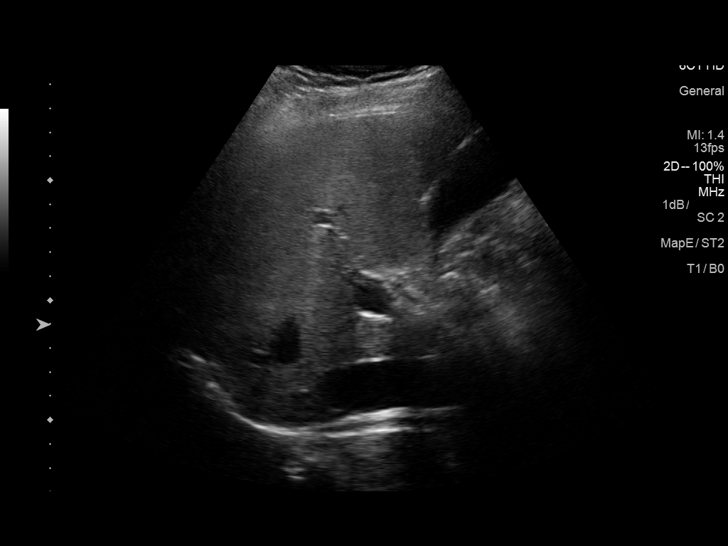
[im 33/80]
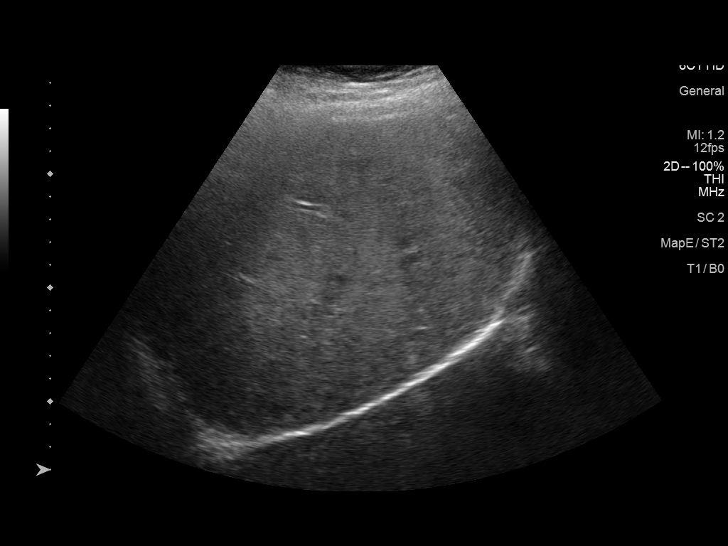
[im 40/80]
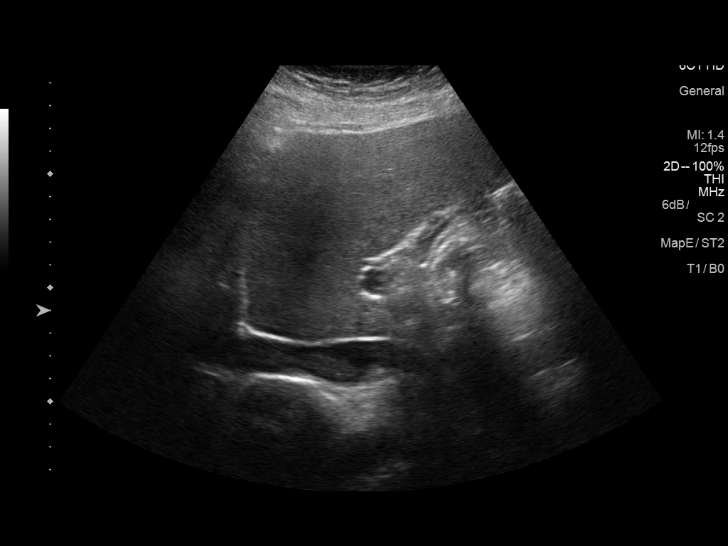
[im 47/80]
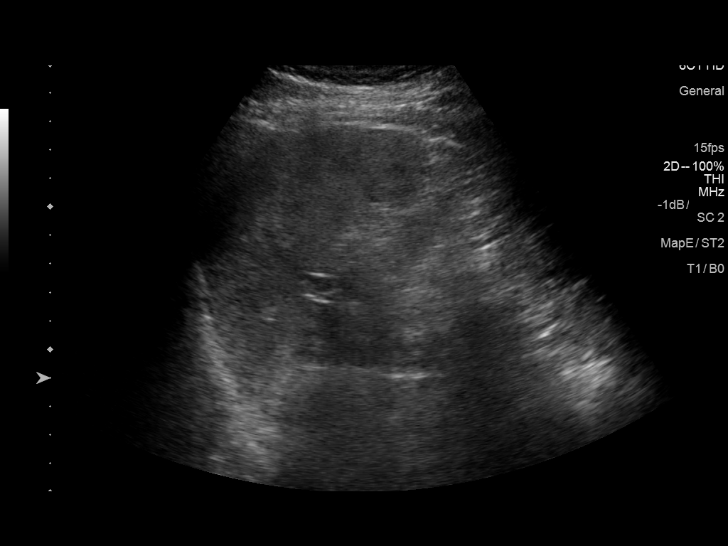
[im 53/80]
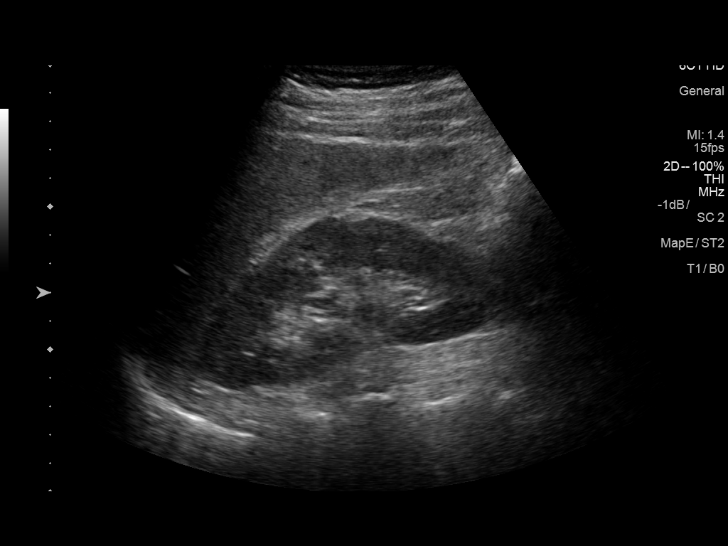
[im 60/80]
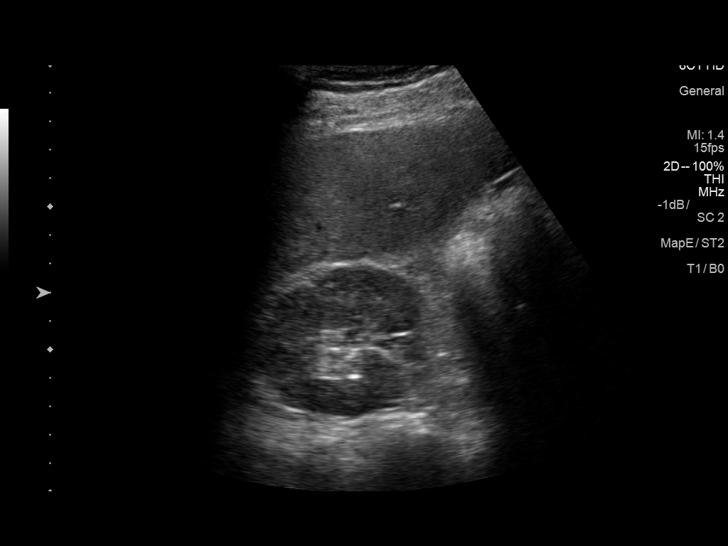
[im 66/80]
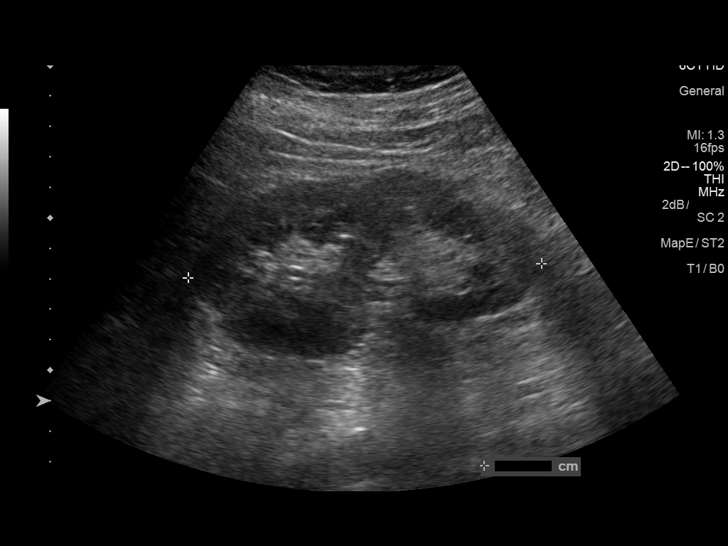
[im 73/80]
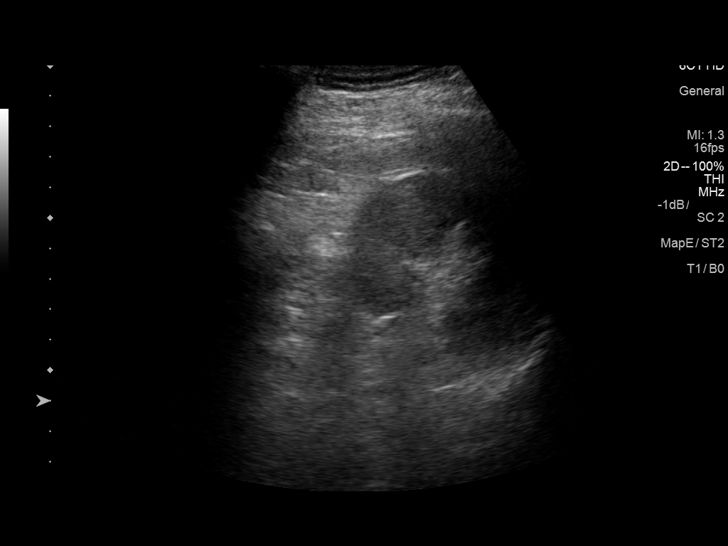
[im 80/80]
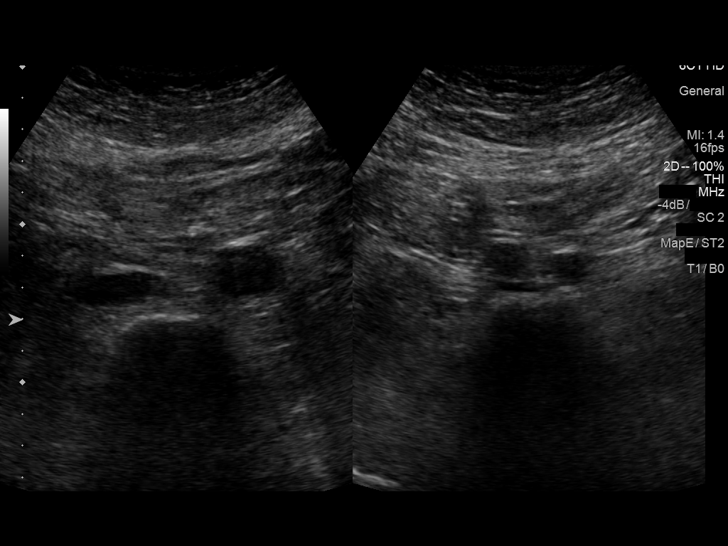

[13 of 25 positions shown; findings below may reference images not displayed]

FINDINGS: Gallbladder: Normally distended without stones or wall thickening.
No pericholecystic fluid or sonographic Murphy sign.

Common bile duct: Diameter: Normal caliber 4 mm diameter

Liver: Slightly echogenic parenchyma, likely fatty infiltration
though this can be seen with cirrhosis and certain infiltrative
disorders. Hypoechoic nodule medial LEFT lobe 10 x 9 x 8 mm showing
few scattered internal echoes but enhanced through transmission
likely a minimally complicated cyst. No additional hepatic masses or
nodularity. Portal vein is patent on color Doppler imaging with
normal direction of blood flow towards the liver.

IVC: Normal appearance

Pancreas: Distal tail incompletely visualized due to bowel gas,
visualized pancreas normal appearance

Spleen: Normal appearance, 9.0 cm length.  No focal lesion.

Right Kidney: Length: 11.6 cm. Normal morphology without mass or
hydronephrosis.

Left Kidney: Length: 11.6 cm. Normal morphology without mass or
hydronephrosis.

Abdominal aorta: Normal caliber

Other findings: No free fluid
IMPRESSION: Probable mild fatty infiltration of liver with a minimally
complicated cyst within the LEFT lobe 10 mm diameter.

Normal spleen size without focal mass lesion.

Remainder of exam unremarkable.

## 2020-07-23 ENCOUNTER — Other Ambulatory Visit (HOSPITAL_COMMUNITY): Payer: Self-pay

## 2020-07-23 MED FILL — Rosuvastatin Calcium Tab 20 MG: ORAL | 90 days supply | Qty: 90 | Fill #0 | Status: AC

## 2020-07-23 MED FILL — Sildenafil Citrate Tab 20 MG: ORAL | 30 days supply | Qty: 50 | Fill #0 | Status: AC

## 2020-07-23 MED FILL — Losartan Potassium & Hydrochlorothiazide Tab 100-12.5 MG: ORAL | 90 days supply | Qty: 90 | Fill #1 | Status: AC

## 2020-10-25 ENCOUNTER — Other Ambulatory Visit (HOSPITAL_COMMUNITY): Payer: Self-pay

## 2020-10-25 MED FILL — Rosuvastatin Calcium Tab 20 MG: ORAL | 90 days supply | Qty: 90 | Fill #1 | Status: AC

## 2020-10-25 MED FILL — Losartan Potassium & Hydrochlorothiazide Tab 100-12.5 MG: ORAL | 90 days supply | Qty: 90 | Fill #2 | Status: AC

## 2021-01-29 ENCOUNTER — Other Ambulatory Visit (HOSPITAL_COMMUNITY): Payer: Self-pay

## 2021-01-29 MED FILL — Rosuvastatin Calcium Tab 20 MG: ORAL | 90 days supply | Qty: 90 | Fill #2 | Status: AC

## 2021-01-29 MED FILL — Losartan Potassium & Hydrochlorothiazide Tab 100-12.5 MG: ORAL | 90 days supply | Qty: 90 | Fill #3 | Status: AC

## 2021-01-29 MED FILL — Sildenafil Citrate Tab 20 MG: ORAL | 30 days supply | Qty: 50 | Fill #1 | Status: AC

## 2021-03-10 DIAGNOSIS — H5213 Myopia, bilateral: Secondary | ICD-10-CM | POA: Diagnosis not present

## 2021-03-12 ENCOUNTER — Other Ambulatory Visit (HOSPITAL_COMMUNITY): Payer: Self-pay

## 2021-04-16 DIAGNOSIS — Z0000 Encounter for general adult medical examination without abnormal findings: Secondary | ICD-10-CM | POA: Diagnosis not present

## 2021-04-16 DIAGNOSIS — Z13220 Encounter for screening for lipoid disorders: Secondary | ICD-10-CM | POA: Diagnosis not present

## 2021-04-21 ENCOUNTER — Other Ambulatory Visit (HOSPITAL_COMMUNITY): Payer: Self-pay

## 2021-04-21 DIAGNOSIS — Z0000 Encounter for general adult medical examination without abnormal findings: Secondary | ICD-10-CM | POA: Diagnosis not present

## 2021-04-21 DIAGNOSIS — I10 Essential (primary) hypertension: Secondary | ICD-10-CM | POA: Diagnosis not present

## 2021-04-21 DIAGNOSIS — R7303 Prediabetes: Secondary | ICD-10-CM | POA: Diagnosis not present

## 2021-04-21 DIAGNOSIS — Z862 Personal history of diseases of the blood and blood-forming organs and certain disorders involving the immune mechanism: Secondary | ICD-10-CM | POA: Diagnosis not present

## 2021-04-21 DIAGNOSIS — E7800 Pure hypercholesterolemia, unspecified: Secondary | ICD-10-CM | POA: Diagnosis not present

## 2021-04-21 DIAGNOSIS — J3089 Other allergic rhinitis: Secondary | ICD-10-CM | POA: Diagnosis not present

## 2021-04-21 MED ORDER — FLUTICASONE PROPIONATE 50 MCG/ACT NA SUSP
1.0000 | Freq: Every day | NASAL | 4 refills | Status: AC
  Filled 2021-04-21 – 2021-05-01 (×2): qty 16, 60d supply, fill #0

## 2021-04-21 MED ORDER — SILDENAFIL CITRATE 20 MG PO TABS
20.0000 mg | ORAL_TABLET | Freq: Every day | ORAL | 3 refills | Status: DC
  Filled 2021-04-21: qty 50, 10d supply, fill #0
  Filled 2021-05-01: qty 50, 30d supply, fill #0
  Filled 2021-07-14: qty 50, 30d supply, fill #1
  Filled 2021-11-03: qty 50, 30d supply, fill #2
  Filled 2022-02-04: qty 50, 30d supply, fill #3

## 2021-04-21 MED ORDER — ROSUVASTATIN CALCIUM 20 MG PO TABS
20.0000 mg | ORAL_TABLET | Freq: Every day | ORAL | 4 refills | Status: DC
  Filled 2021-04-21 – 2021-05-01 (×2): qty 90, 90d supply, fill #0
  Filled 2021-07-14: qty 90, 90d supply, fill #1
  Filled 2021-11-03: qty 90, 90d supply, fill #2
  Filled 2022-02-04: qty 90, 90d supply, fill #3

## 2021-04-21 MED ORDER — LOSARTAN POTASSIUM-HCTZ 100-12.5 MG PO TABS
1.0000 | ORAL_TABLET | Freq: Every day | ORAL | 4 refills | Status: DC
  Filled 2021-04-21 – 2021-05-01 (×2): qty 90, 90d supply, fill #0
  Filled 2021-07-14: qty 90, 90d supply, fill #1
  Filled 2021-11-03: qty 90, 90d supply, fill #2
  Filled 2022-02-04: qty 90, 90d supply, fill #3

## 2021-04-29 ENCOUNTER — Other Ambulatory Visit (HOSPITAL_COMMUNITY): Payer: Self-pay

## 2021-04-29 ENCOUNTER — Other Ambulatory Visit: Payer: Self-pay

## 2021-05-01 ENCOUNTER — Other Ambulatory Visit (HOSPITAL_COMMUNITY): Payer: Self-pay

## 2021-07-14 ENCOUNTER — Other Ambulatory Visit (HOSPITAL_COMMUNITY): Payer: Self-pay

## 2021-07-17 ENCOUNTER — Other Ambulatory Visit (HOSPITAL_COMMUNITY): Payer: Self-pay

## 2021-10-06 ENCOUNTER — Encounter: Payer: Self-pay | Admitting: *Deleted

## 2021-10-28 ENCOUNTER — Other Ambulatory Visit (HOSPITAL_COMMUNITY): Payer: Self-pay

## 2021-10-28 MED ORDER — PEG 3350-KCL-NA BICARB-NACL 420 G PO SOLR
ORAL | 0 refills | Status: AC
  Filled 2021-10-28: qty 4000, 1d supply, fill #0

## 2021-11-03 ENCOUNTER — Other Ambulatory Visit (HOSPITAL_COMMUNITY): Payer: Self-pay

## 2021-11-07 DIAGNOSIS — K5730 Diverticulosis of large intestine without perforation or abscess without bleeding: Secondary | ICD-10-CM | POA: Diagnosis not present

## 2021-11-07 DIAGNOSIS — K649 Unspecified hemorrhoids: Secondary | ICD-10-CM | POA: Diagnosis not present

## 2021-11-07 DIAGNOSIS — Z1211 Encounter for screening for malignant neoplasm of colon: Secondary | ICD-10-CM | POA: Diagnosis not present

## 2021-12-25 ENCOUNTER — Encounter: Payer: Self-pay | Admitting: *Deleted

## 2022-02-04 ENCOUNTER — Other Ambulatory Visit: Payer: Self-pay

## 2022-02-04 ENCOUNTER — Other Ambulatory Visit (HOSPITAL_COMMUNITY): Payer: Self-pay

## 2022-04-22 DIAGNOSIS — Z0000 Encounter for general adult medical examination without abnormal findings: Secondary | ICD-10-CM | POA: Diagnosis not present

## 2022-04-22 DIAGNOSIS — Z125 Encounter for screening for malignant neoplasm of prostate: Secondary | ICD-10-CM | POA: Diagnosis not present

## 2022-04-22 DIAGNOSIS — Z13220 Encounter for screening for lipoid disorders: Secondary | ICD-10-CM | POA: Diagnosis not present

## 2022-04-22 DIAGNOSIS — R7303 Prediabetes: Secondary | ICD-10-CM | POA: Diagnosis not present

## 2022-04-27 ENCOUNTER — Other Ambulatory Visit (HOSPITAL_COMMUNITY): Payer: Self-pay

## 2022-04-27 ENCOUNTER — Other Ambulatory Visit: Payer: Self-pay | Admitting: Family Medicine

## 2022-04-27 DIAGNOSIS — K760 Fatty (change of) liver, not elsewhere classified: Secondary | ICD-10-CM | POA: Diagnosis not present

## 2022-04-27 DIAGNOSIS — E7800 Pure hypercholesterolemia, unspecified: Secondary | ICD-10-CM | POA: Diagnosis not present

## 2022-04-27 DIAGNOSIS — Z0000 Encounter for general adult medical examination without abnormal findings: Secondary | ICD-10-CM | POA: Diagnosis not present

## 2022-04-27 DIAGNOSIS — Z862 Personal history of diseases of the blood and blood-forming organs and certain disorders involving the immune mechanism: Secondary | ICD-10-CM | POA: Diagnosis not present

## 2022-04-27 DIAGNOSIS — Z6828 Body mass index (BMI) 28.0-28.9, adult: Secondary | ICD-10-CM | POA: Diagnosis not present

## 2022-04-27 DIAGNOSIS — I10 Essential (primary) hypertension: Secondary | ICD-10-CM | POA: Diagnosis not present

## 2022-04-27 DIAGNOSIS — Z23 Encounter for immunization: Secondary | ICD-10-CM | POA: Diagnosis not present

## 2022-04-27 DIAGNOSIS — J309 Allergic rhinitis, unspecified: Secondary | ICD-10-CM | POA: Diagnosis not present

## 2022-04-27 DIAGNOSIS — R7303 Prediabetes: Secondary | ICD-10-CM | POA: Diagnosis not present

## 2022-04-27 MED ORDER — FLUTICASONE PROPIONATE 50 MCG/ACT NA SUSP
1.0000 | Freq: Every day | NASAL | 4 refills | Status: AC | PRN
  Filled 2022-04-27: qty 16, 60d supply, fill #0

## 2022-04-27 MED ORDER — LOSARTAN POTASSIUM-HCTZ 100-12.5 MG PO TABS
1.0000 | ORAL_TABLET | Freq: Every day | ORAL | 4 refills | Status: DC
  Filled 2022-04-27: qty 90, 90d supply, fill #0
  Filled 2022-08-05: qty 90, 90d supply, fill #1
  Filled 2022-11-04: qty 90, 90d supply, fill #2
  Filled 2023-02-11: qty 90, 90d supply, fill #3

## 2022-04-27 MED ORDER — ROSUVASTATIN CALCIUM 20 MG PO TABS
20.0000 mg | ORAL_TABLET | Freq: Every day | ORAL | 4 refills | Status: DC
  Filled 2022-04-27: qty 90, 90d supply, fill #0
  Filled 2022-08-05: qty 90, 90d supply, fill #1
  Filled 2022-11-04: qty 90, 90d supply, fill #2
  Filled 2023-02-11: qty 90, 90d supply, fill #3

## 2022-04-27 MED ORDER — SILDENAFIL CITRATE 20 MG PO TABS
100.0000 mg | ORAL_TABLET | Freq: Every day | ORAL | 3 refills | Status: DC
  Filled 2022-04-27: qty 50, 10d supply, fill #0
  Filled 2022-11-04: qty 50, 10d supply, fill #1
  Filled 2023-02-11: qty 50, 10d supply, fill #2

## 2022-05-07 ENCOUNTER — Other Ambulatory Visit (HOSPITAL_COMMUNITY): Payer: Self-pay

## 2022-06-04 ENCOUNTER — Ambulatory Visit
Admission: RE | Admit: 2022-06-04 | Discharge: 2022-06-04 | Disposition: A | Discharge status: Home / Self Care | Payer: 59 | Source: Ambulatory Visit | Attending: Family Medicine | Admitting: Family Medicine

## 2022-06-04 DIAGNOSIS — K760 Fatty (change of) liver, not elsewhere classified: Secondary | ICD-10-CM | POA: Diagnosis not present

## 2022-09-07 DIAGNOSIS — Z800 Family history of malignant neoplasm of digestive organs: Secondary | ICD-10-CM | POA: Diagnosis not present

## 2022-09-07 DIAGNOSIS — R1032 Left lower quadrant pain: Secondary | ICD-10-CM | POA: Diagnosis not present

## 2022-09-07 DIAGNOSIS — Z6828 Body mass index (BMI) 28.0-28.9, adult: Secondary | ICD-10-CM | POA: Diagnosis not present

## 2022-11-04 ENCOUNTER — Other Ambulatory Visit (HOSPITAL_COMMUNITY): Payer: Self-pay

## 2022-11-05 ENCOUNTER — Other Ambulatory Visit (HOSPITAL_COMMUNITY): Payer: Self-pay

## 2023-02-11 ENCOUNTER — Other Ambulatory Visit (HOSPITAL_COMMUNITY): Payer: Self-pay

## 2023-03-22 ENCOUNTER — Other Ambulatory Visit (HOSPITAL_COMMUNITY): Payer: Self-pay

## 2023-03-22 MED ORDER — PROPRANOLOL HCL ER 60 MG PO CP24
60.0000 mg | ORAL_CAPSULE | Freq: Every day | ORAL | 1 refills | Status: AC
  Filled 2023-03-22: qty 30, 30d supply, fill #0

## 2023-03-23 ENCOUNTER — Other Ambulatory Visit (HOSPITAL_COMMUNITY): Payer: Self-pay

## 2023-05-11 ENCOUNTER — Other Ambulatory Visit (HOSPITAL_COMMUNITY): Payer: Self-pay

## 2023-05-11 MED ORDER — SILDENAFIL CITRATE 20 MG PO TABS
20.0000 mg | ORAL_TABLET | Freq: Every day | ORAL | 3 refills | Status: AC
  Filled 2023-05-11: qty 50, 50d supply, fill #0
  Filled 2023-09-07: qty 50, 50d supply, fill #1
  Filled 2023-12-14: qty 50, 50d supply, fill #2

## 2023-05-11 MED ORDER — FLUTICASONE PROPIONATE 50 MCG/ACT NA SUSP
1.0000 | Freq: Every day | NASAL | 4 refills | Status: AC | PRN
  Filled 2023-05-11: qty 16, 60d supply, fill #0

## 2023-05-11 MED ORDER — LOSARTAN POTASSIUM-HCTZ 100-12.5 MG PO TABS
1.0000 | ORAL_TABLET | Freq: Every day | ORAL | 4 refills | Status: AC
  Filled 2023-05-11: qty 90, 90d supply, fill #0
  Filled 2023-09-07: qty 90, 90d supply, fill #1
  Filled 2023-12-14: qty 90, 90d supply, fill #2

## 2023-05-11 MED ORDER — ROSUVASTATIN CALCIUM 20 MG PO TABS
20.0000 mg | ORAL_TABLET | Freq: Every day | ORAL | 4 refills | Status: AC
  Filled 2023-05-11: qty 90, 90d supply, fill #0
  Filled 2023-09-07: qty 90, 90d supply, fill #1
  Filled 2023-12-14: qty 90, 90d supply, fill #2

## 2023-05-17 ENCOUNTER — Other Ambulatory Visit (HOSPITAL_COMMUNITY): Payer: Self-pay

## 2023-09-07 ENCOUNTER — Other Ambulatory Visit (HOSPITAL_COMMUNITY): Payer: Self-pay

## 2023-09-09 ENCOUNTER — Other Ambulatory Visit (HOSPITAL_COMMUNITY): Payer: Self-pay

## 2023-12-14 ENCOUNTER — Other Ambulatory Visit (HOSPITAL_COMMUNITY): Payer: Self-pay
